# Patient Record
Sex: Male | Born: 1935 | Race: Black or African American | Hispanic: No | Smoking: Never smoker
Health system: Southern US, Community
[De-identification: ages and names within clinical notes are randomized; demographics above are authoritative.]

## PROBLEM LIST (undated history)

## (undated) DIAGNOSIS — N400 Enlarged prostate without lower urinary tract symptoms: Secondary | ICD-10-CM

## (undated) DIAGNOSIS — N189 Chronic kidney disease, unspecified: Secondary | ICD-10-CM

## (undated) DIAGNOSIS — K259 Gastric ulcer, unspecified as acute or chronic, without hemorrhage or perforation: Secondary | ICD-10-CM

## (undated) DIAGNOSIS — I10 Essential (primary) hypertension: Secondary | ICD-10-CM

## (undated) DIAGNOSIS — E785 Hyperlipidemia, unspecified: Secondary | ICD-10-CM

## (undated) HISTORY — DX: Benign prostatic hyperplasia without lower urinary tract symptoms: N40.0

## (undated) HISTORY — PX: COLONOSCOPY W/ POLYPECTOMY: SHX1380

## (undated) HISTORY — DX: Hyperlipidemia, unspecified: E78.5

## (undated) HISTORY — PX: HERNIA REPAIR: SHX51

## (undated) HISTORY — DX: Essential (primary) hypertension: I10

---

## 2007-01-22 ENCOUNTER — Ambulatory Visit: Payer: Self-pay | Admitting: Gastroenterology

## 2008-03-14 ENCOUNTER — Emergency Department: Payer: Self-pay | Admitting: Emergency Medicine

## 2010-03-11 ENCOUNTER — Emergency Department: Payer: Self-pay | Admitting: Emergency Medicine

## 2010-03-14 ENCOUNTER — Emergency Department: Payer: Self-pay | Admitting: Emergency Medicine

## 2010-07-01 ENCOUNTER — Emergency Department: Payer: Self-pay | Admitting: Unknown Physician Specialty

## 2010-09-19 ENCOUNTER — Ambulatory Visit: Payer: Self-pay | Admitting: Gastroenterology

## 2010-09-21 LAB — PATHOLOGY REPORT

## 2011-05-23 HISTORY — PX: COLONOSCOPY: SHX5424

## 2011-07-18 ENCOUNTER — Emergency Department: Payer: Self-pay | Admitting: Emergency Medicine

## 2012-05-14 IMAGING — US US PELVIS LIMITED
1 series · 17 of 25 positions shown · non-contrast
Comparison: none

REASON FOR EXAM: left tesrticular pain and swelling
COMMENTS:

[Series 1: us pelvis limited · 17 of 113 slices shown]
[im 1/113]
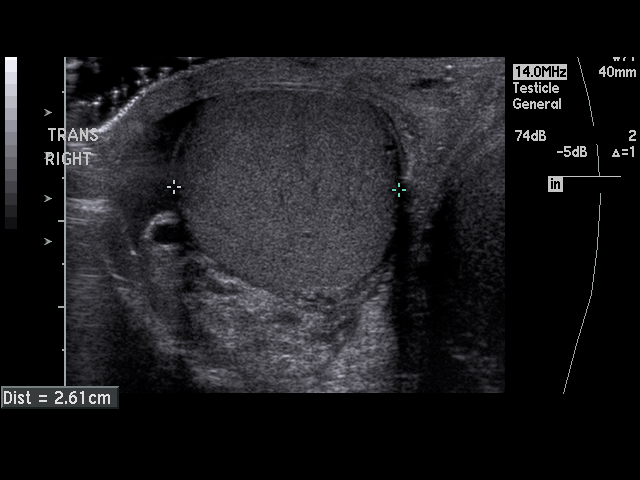
[im 10/113]
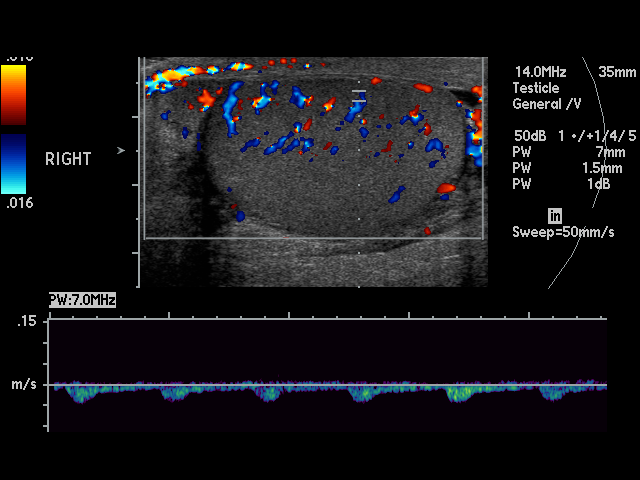
[im 15/113]
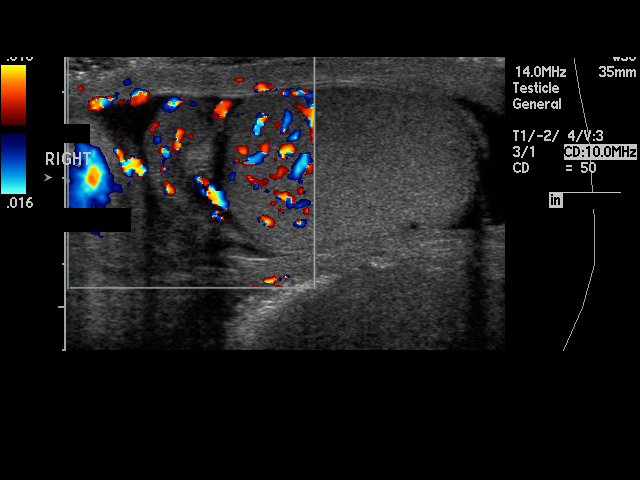
[im 24/113]
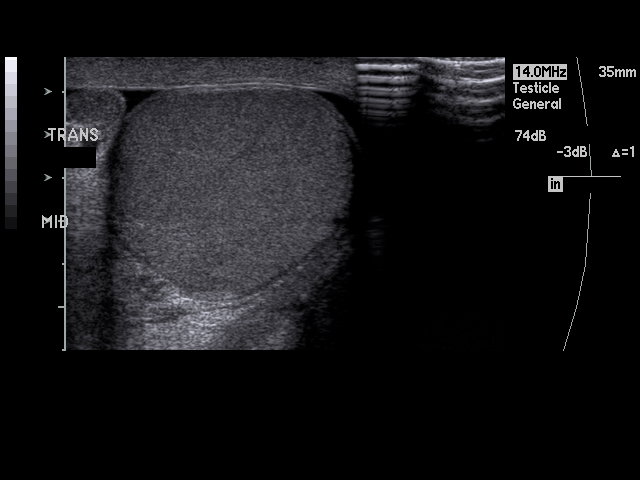
[im 29/113]
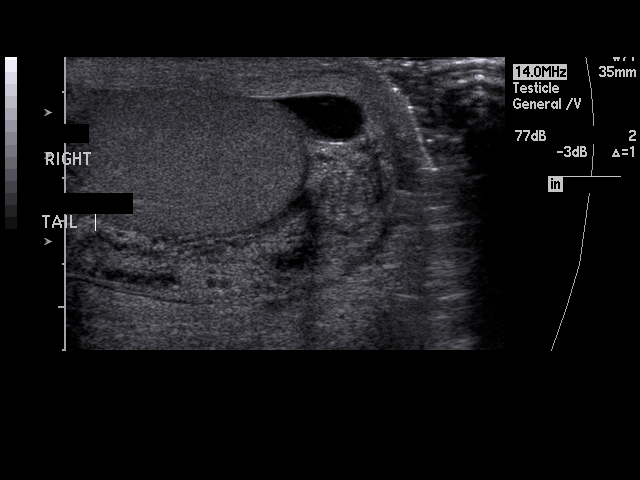
[im 38/113]
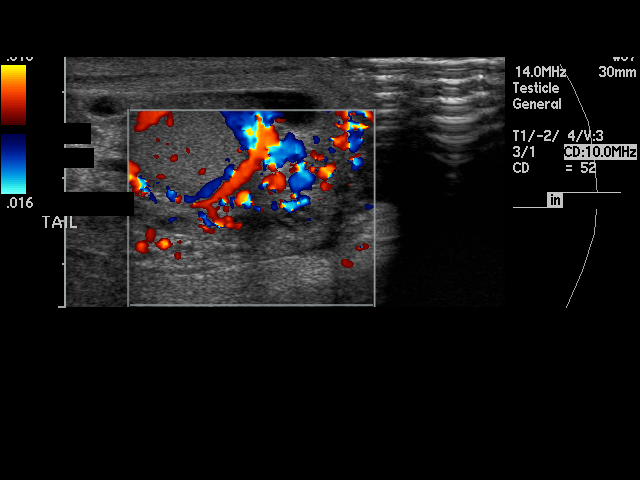
[im 43/113]
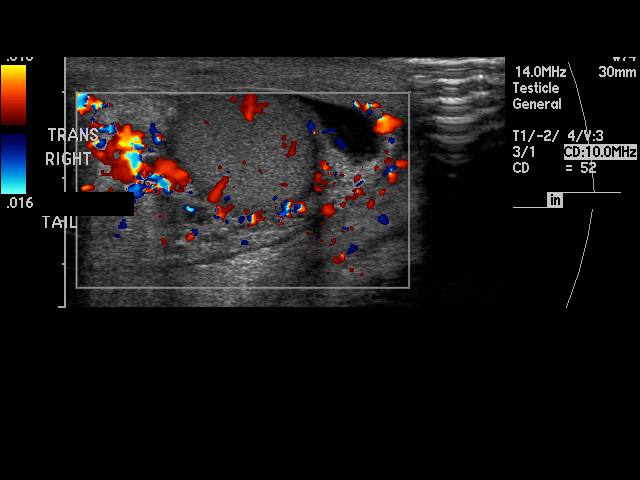
[im 52/113]
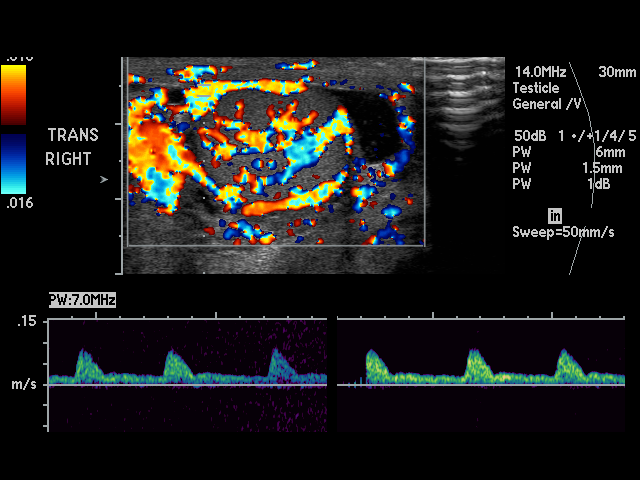
[im 57/113]
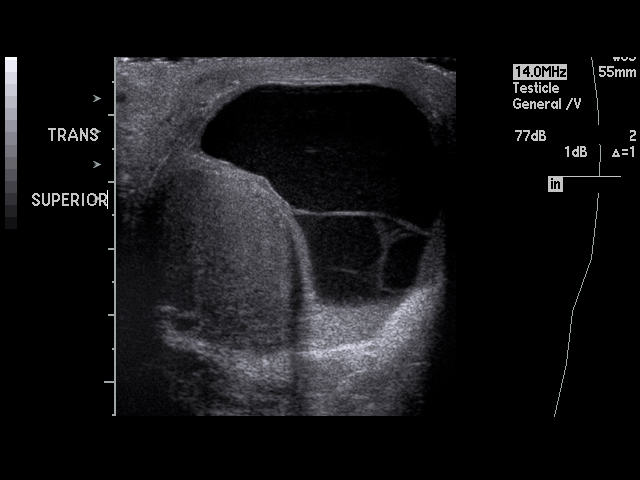
[im 61/113]
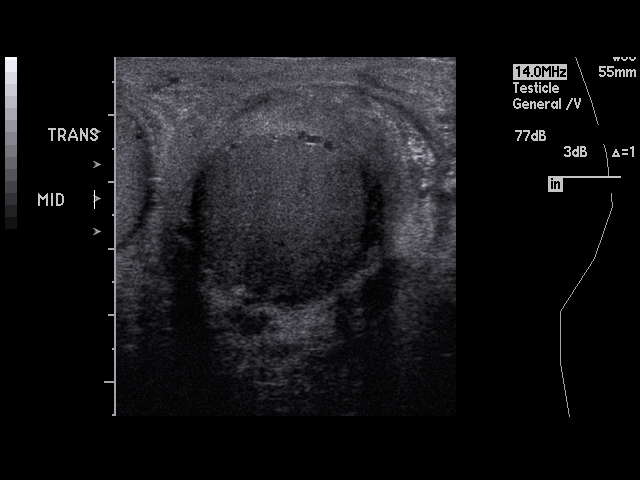
[im 71/113]
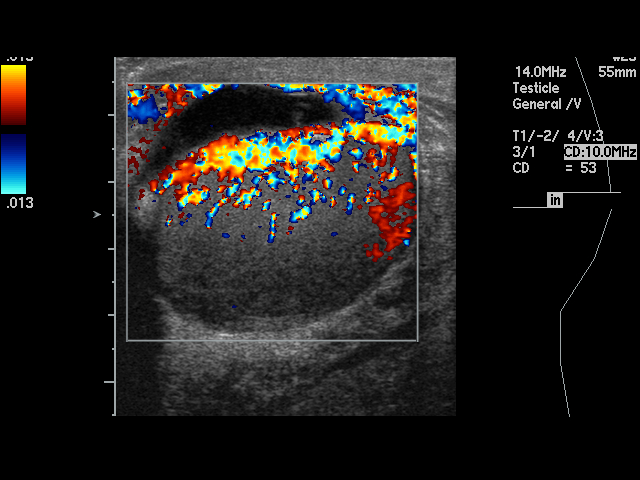
[im 75/113]
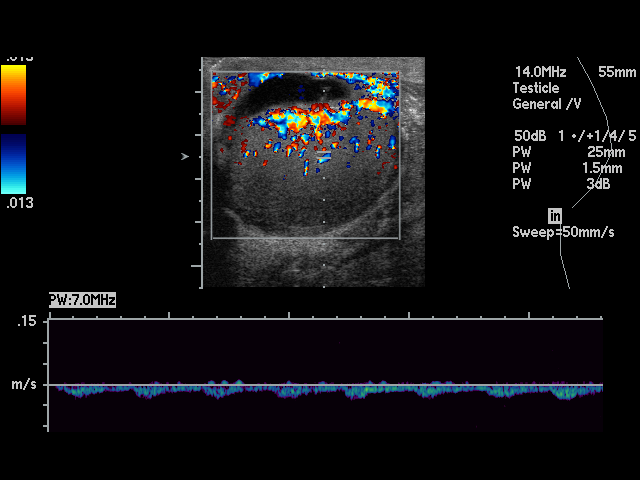
[im 85/113]
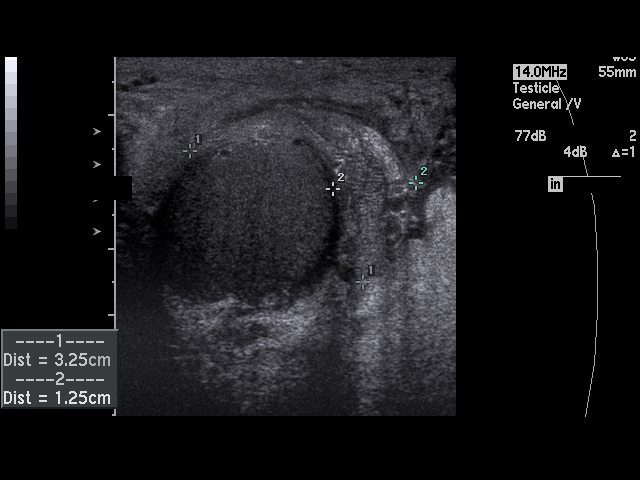
[im 89/113]
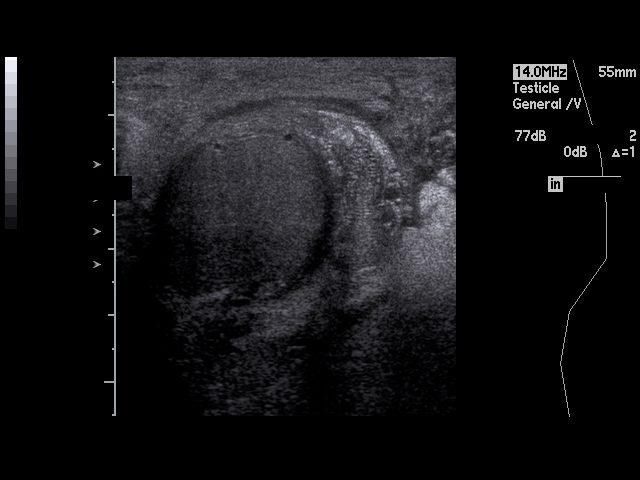
[im 99/113]
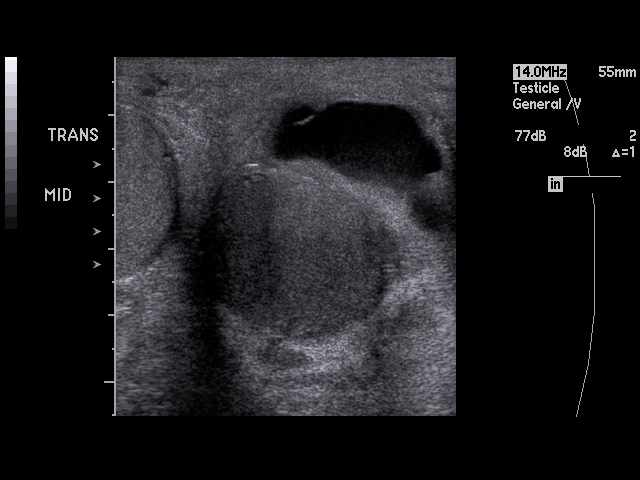
[im 103/113]
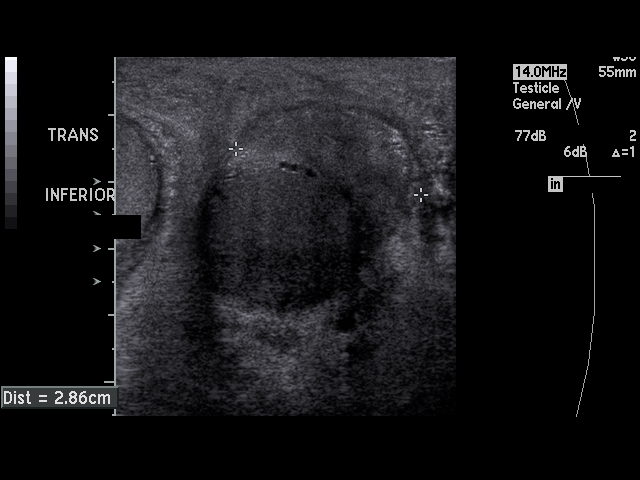
[im 113/113]
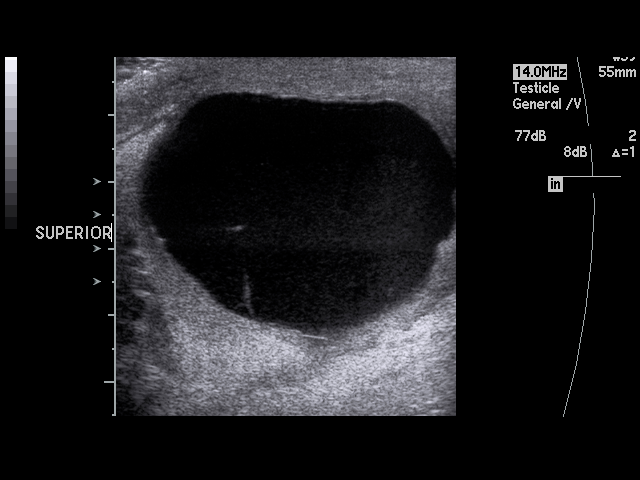

[17 of 25 positions shown; findings below may reference images not displayed]

PROCEDURE:     US  - US TESTICULAR  - July 19, 2011  [DATE]

RESULT:     The testes demonstrate normal echotexture and vascularity
bilaterally. The right testicle measures 3.9 x 2.4 x 2.6 cm. The left
testicle measures 4.3 x 2.6 x 2.6 cm.

There is a septated hypoechoic to anechoic structure with some layering
debris in the left aspect of the scrotum which may reflect a complicating
hydrocele or possibly a scrotal abscess. The epididymis on the left is
enlarged measuring 3.3 x 1.2 x 2.9 cm and exhibits increased vascularity.
The epididymal structures on the right are normal in appearance and
vascularity measuring 3 by 0.8 x 2.8 cm. Thickening of the scrotal wall is
seen diffusely.
IMPRESSION: 1. I see no intratesticular mass.
2. There is a complex appearing fluid collection on the left. This may
reflect a hydrocele into which hemorrhage has occurred or a possibly
infected hydrocele.
3. There is enlargement of hypervascularity of the epididymis suggesting
epididymitis. This overlying scrotal thickening. I do not see findings to
suggest air within the scrotal soft tissues.

A primary report was sent to the [HOSPITAL] the conclusion of
the study.

## 2012-06-03 ENCOUNTER — Ambulatory Visit: Payer: Self-pay | Admitting: Surgery

## 2012-06-03 ENCOUNTER — Ambulatory Visit: Payer: Self-pay | Admitting: Nephrology

## 2012-06-03 LAB — CBC WITH DIFFERENTIAL/PLATELET
Basophil #: 0 10*3/uL (ref 0.0–0.1)
Eosinophil #: 0 10*3/uL (ref 0.0–0.7)
HCT: 43.1 % (ref 40.0–52.0)
HGB: 15 g/dL (ref 13.0–18.0)
Lymphocyte #: 1.9 10*3/uL (ref 1.0–3.6)
Lymphocyte %: 37.7 %
MCHC: 34.7 g/dL (ref 32.0–36.0)
MCV: 97 fL (ref 80–100)
Monocyte #: 0.5 x10 3/mm (ref 0.2–1.0)
Monocyte %: 10.3 %
Neutrophil #: 2.6 10*3/uL (ref 1.4–6.5)
Neutrophil %: 50.6 %
RDW: 13 % (ref 11.5–14.5)
WBC: 5.1 10*3/uL (ref 3.8–10.6)

## 2012-06-03 LAB — BASIC METABOLIC PANEL
Chloride: 110 mmol/L — ABNORMAL HIGH (ref 98–107)
Co2: 25 mmol/L (ref 21–32)
Creatinine: 1.4 mg/dL — ABNORMAL HIGH (ref 0.60–1.30)
EGFR (Non-African Amer.): 49 — ABNORMAL LOW
Osmolality: 283 (ref 275–301)
Potassium: 4.3 mmol/L (ref 3.5–5.1)
Sodium: 141 mmol/L (ref 136–145)

## 2012-07-17 ENCOUNTER — Ambulatory Visit: Payer: Self-pay | Admitting: Surgery

## 2014-06-30 IMAGING — US US RENAL KIDNEY
1 series · 14 of 25 positions shown · non-contrast
Comparison: none

REASON FOR EXAM: chronic kidney disease
COMMENTS:

[Series 1: us renal kidney · 0.28mm/px · 14 of 32 slices shown]
[im 1/32]
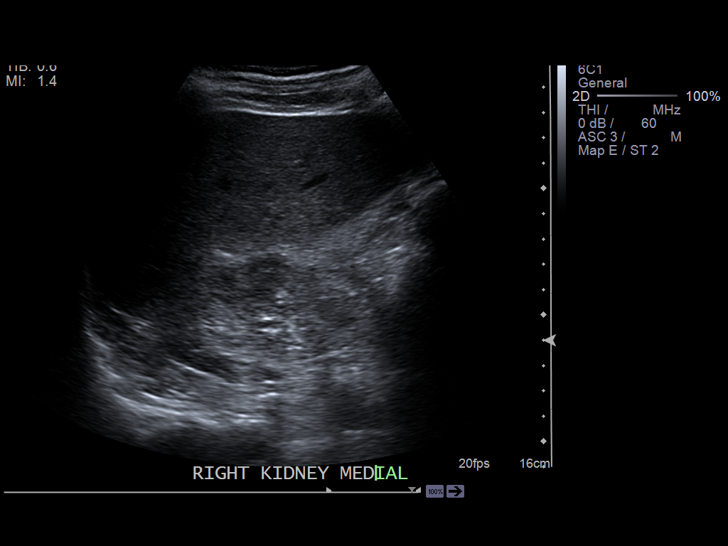
[im 3/32]
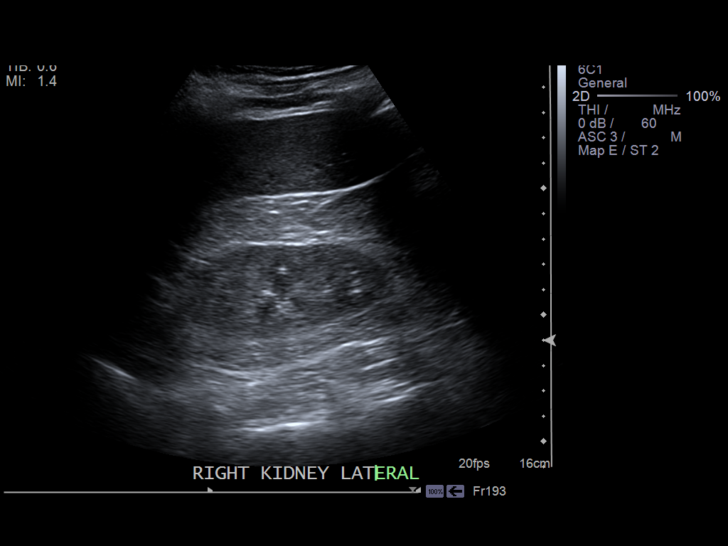
[im 6/32]
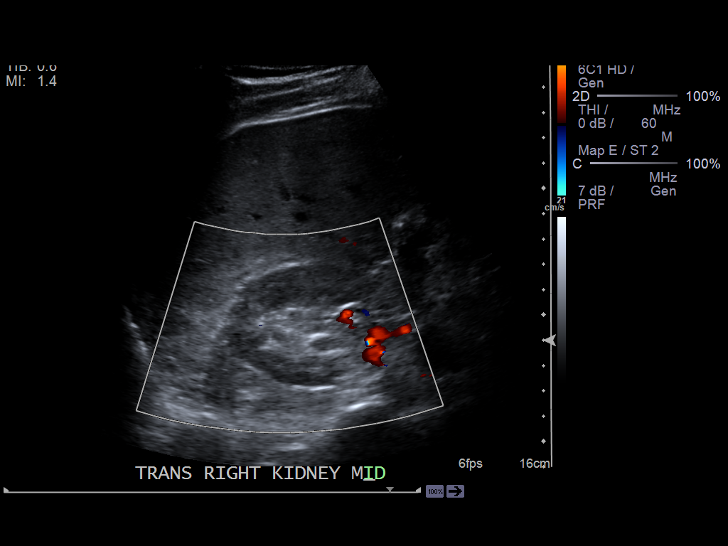
[im 8/32]
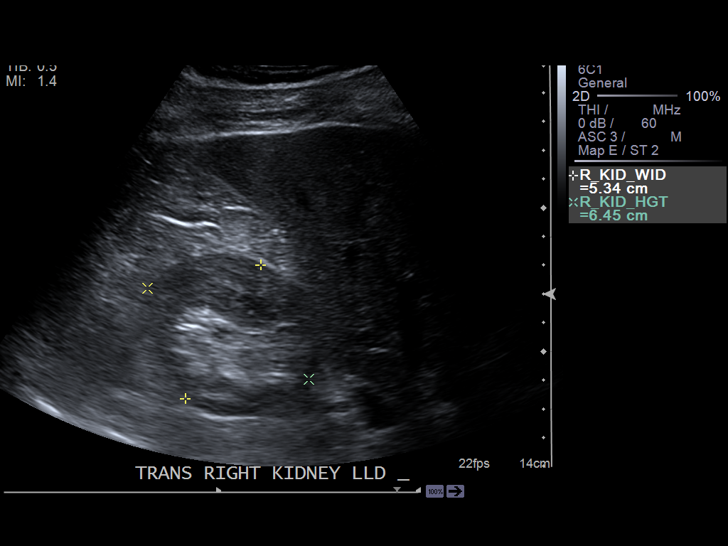
[im 11/32]
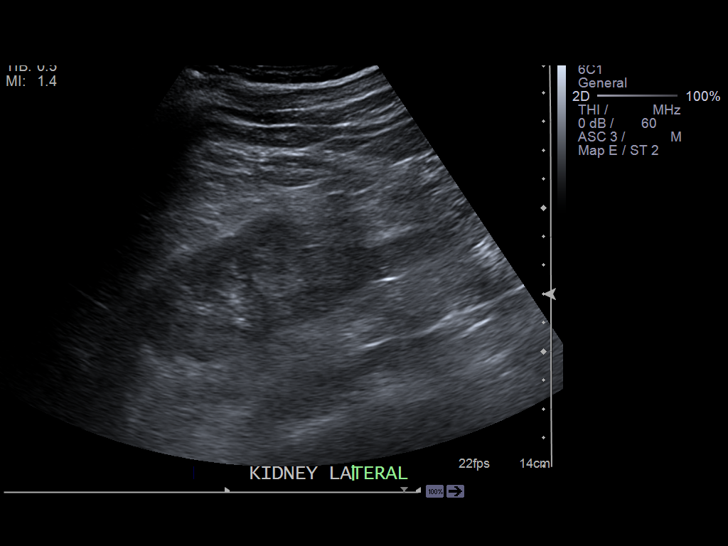
[im 12/32]
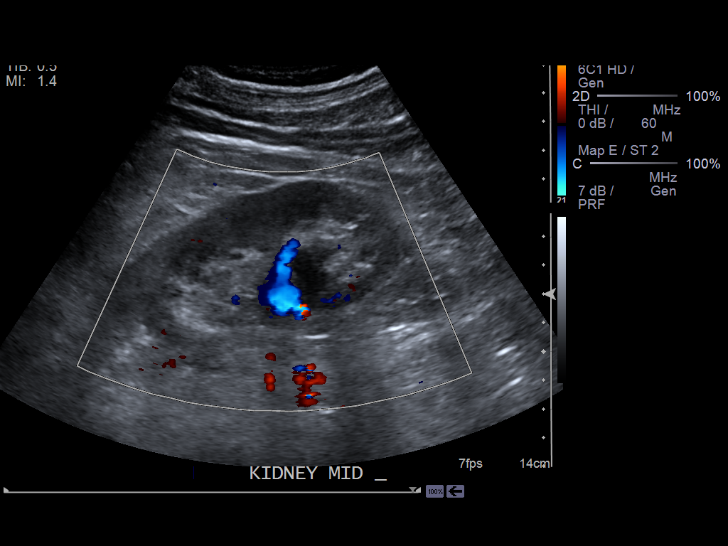
[im 15/32]
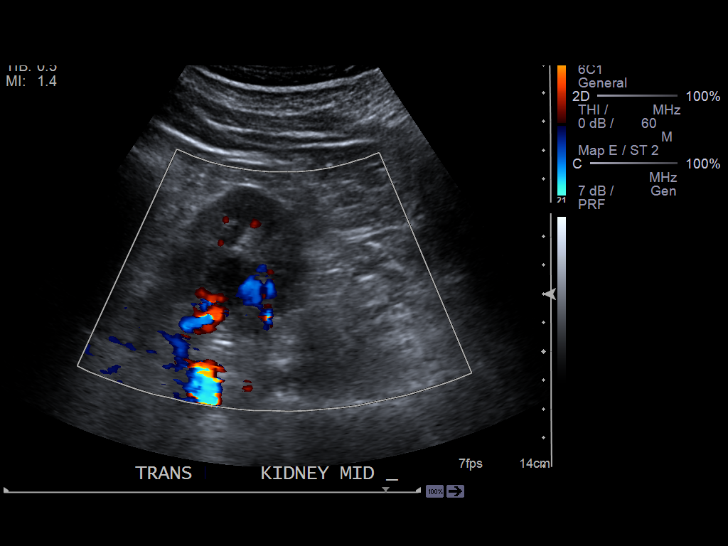
[im 17/32]
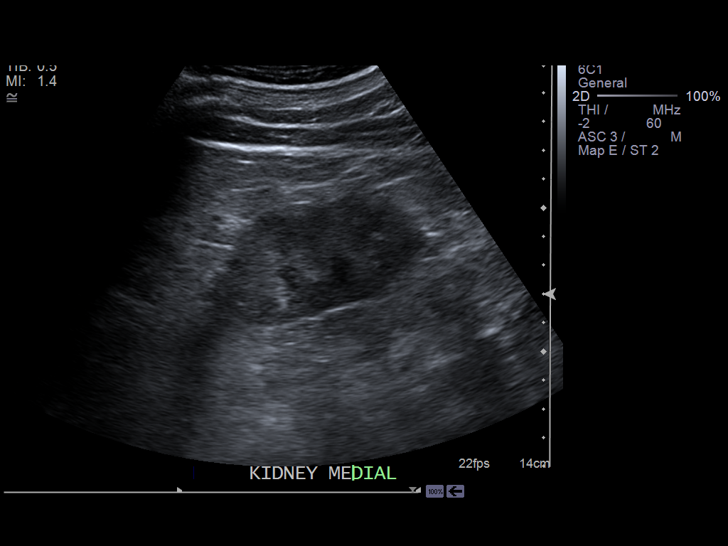
[im 20/32]
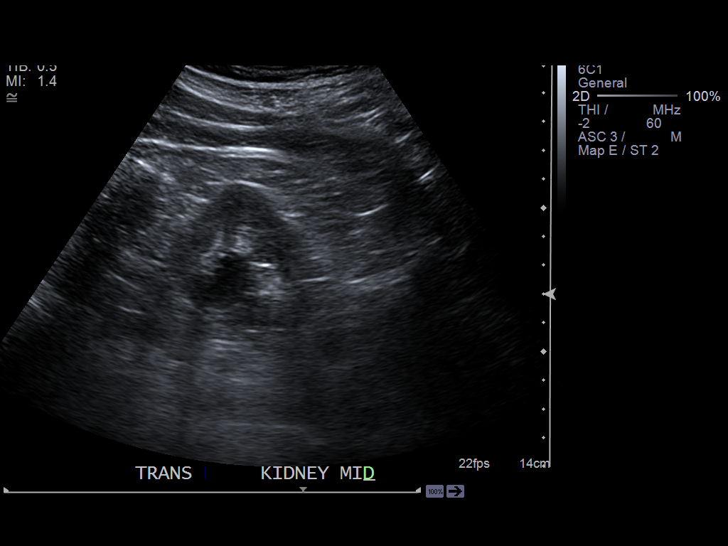
[im 21/32]
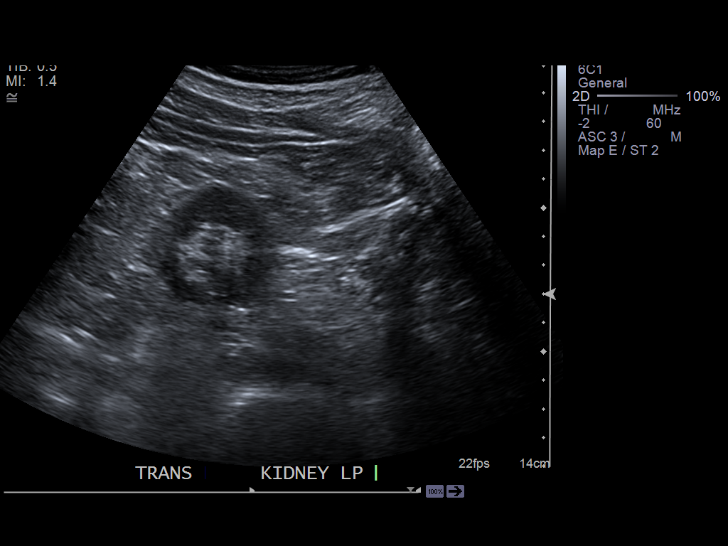
[im 24/32]
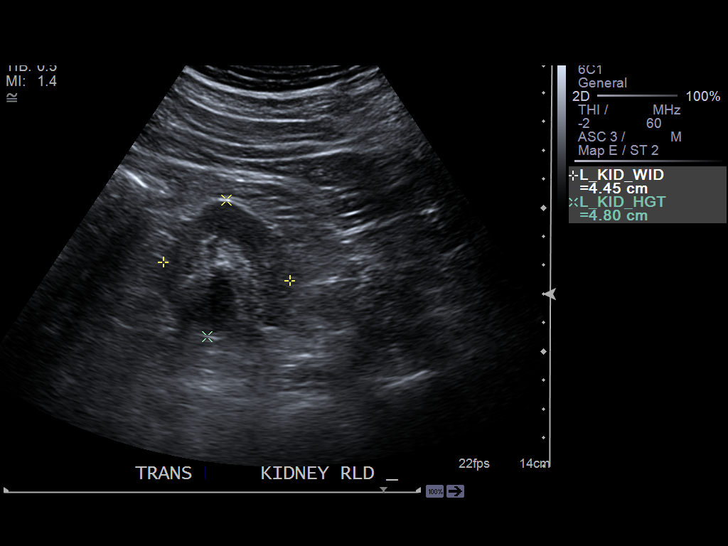
[im 26/32]
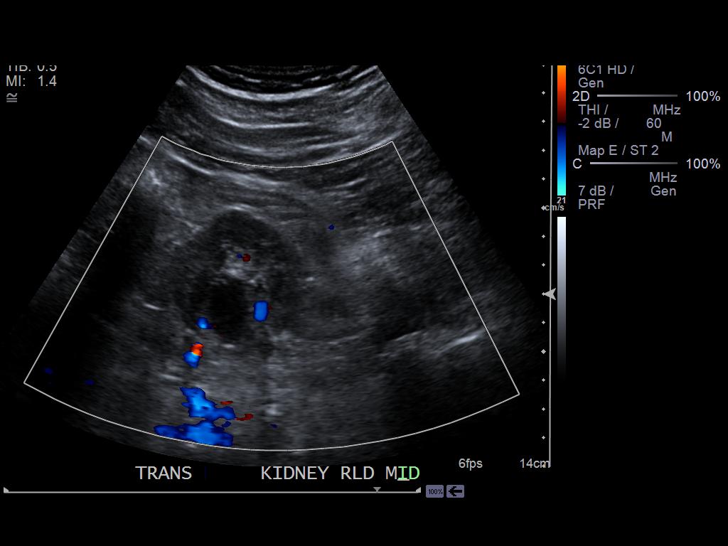
[im 29/32]
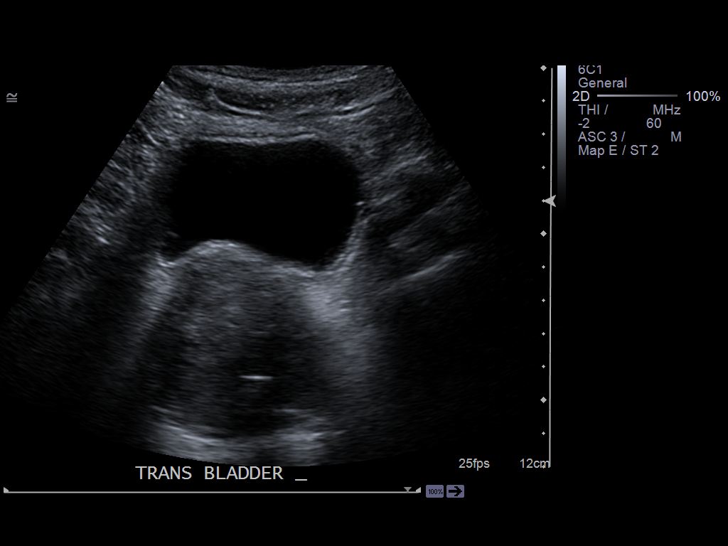
[im 32/32]
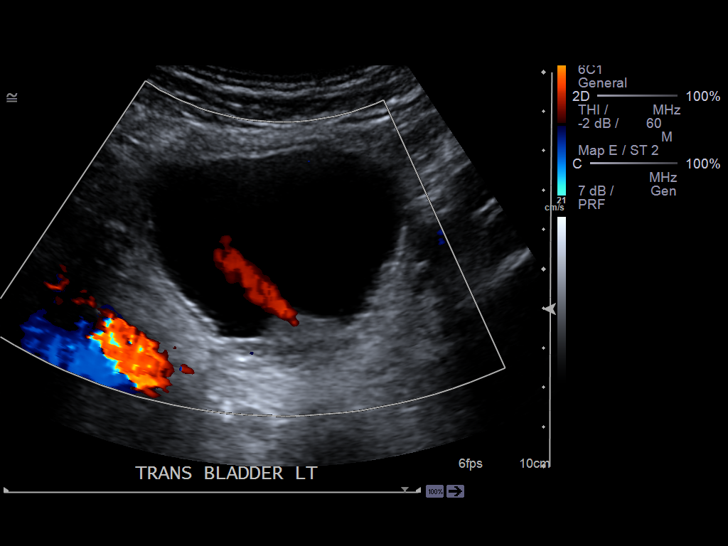

[14 of 25 positions shown; findings below may reference images not displayed]

PROCEDURE:     US  - US KIDNEY  - June 03, 2012  [DATE]

RESULT:     Comparison: None

Technique and findings: Multiple gray-scale and color doppler images of the
kidneys were obtained.

The right kidney measures 9.9 x 5.3 x 5.6 cm and the left kidney measures
11.4 x 4.9 x 4.5 cm. The kidneys are normal in echogenicity. There is no
hydronephrosis. There is mild left pelviectasis. There are no echogenic
foci.  There are no renal masses. There is no free fluid in the region of
the renal fossa.

There is prostatic enlargement. Bilaterally ureteral jets are noted. The
bladder is within normal limits.
IMPRESSION: Normal renal ultrasound.

[REDACTED]

## 2014-09-11 NOTE — Op Note (Signed)
PATIENT NAME:  Victor Walsh, Victor Walsh MR#:  [id number] DATE OF BIRTH:  [birth year]  DATE OF PROCEDURE:  07/17/2012  PREOPERATIVE DIAGNOSES: 1.  Bilateral inguinal hernias.  2.  Umbilical hernia.   POSTOPERATIVE DIAGNOSES:  1.  Bilateral inguinal hernias.  2.  Umbilical hernia.   PROCEDURES: 1.  Laparoscopic preperitoneal repair of bilateral inguinal hernias. 2.  Umbilical herniorrhaphy.  SURGEON:  Phoebe Perch, M.D.  ANESTHESIA:  General with endotracheal tube.   INDICATIONS: This is a patient with painful bulge requiring repair. Preoperatively, we discussed the rationale for surgery, the options of observation, the procedure itself in detail, and the risks of bleeding, infection, recurrence, ischemic orchitis, chronic pain syndrome, neuroma, and cosmetic deformity. This was all reviewed for him in the preop holding area. He understood and agreed to proceed.   FINDINGS:  Small umbilical hernia with incarcerated preperitoneal fat measuring less than 1 cm in size repaired primarily with Ethibond suture.   On the right inguinal hernia was a huge incarcerated direct space hernia and a large indirect space hernia. On the left was a large indirect space hernia with a weak floor.   DESCRIPTION OF PROCEDURE:  The patient was induced to general anesthesia, given IV antibiotics. A Foley catheter was placed. VTE prophylaxis was in place. He was prepped and draped in a sterile fashion. Marcaine was infiltrated in skin and subcutaneous tissues around the periumbilical area. Incision was made. Dissection down to the fascia was performed. The hernia sac was opened and reduced. Fascial edges were cleaned, and figure-of-eight 0 Ethibonds were placed to repair the umbilical hernia.   Additional Marcaine was placed and then attention was turned to the right rectus fascia, which was incised, the muscle retracted laterally. The [place] surgical dissecting balloon was placed,  followed by the [place] surgical structural balloon,  and the preperitoneal space was insufflated under direct vision. Ariahna Smiddy's ligament was delineated after placing 2 midline 5 mm ports. The lateral extent of dissection was determined bilaterally. The nerve was identified and kept in view at all times. The large incarcerated direct space hernia was reduced, as was the indirect hernia sac, which was dissected off of the cord structures on the right, and this was duplicated on the left as well, and then into the preperitoneal space was placed a large size laterally scissored Atrium mesh into each side, which was held in place with the [place] surgical tacker, avoiding the area of the nerve. The preperitoneal space was then desufflated under direct vision. All ports were removed. There was no sign of bowel exuding through peritoneal rents or coming into contact with mesh. Once the preperitoneal space was completely desufflated, all ports were removed. Fascial edges were approximated with figure-of-eight 0 Ethibonds, 3-0 Vicryl were utilized to tack the skin of the umbilicus back to the fascia, and deep sutures of 3-0 Vicryl were placed followed by 4-0 subcuticular Monocryl. Steri-Strips, Mastisol and sterile dressings were placed.   The patient tolerated the procedure well. There were no complications. He was taken to the recovery room in stable condition to be discharged in the care of his family. He will follow up in 10 days.     ____________________________ Jerrol Banana Burt Knack, MD rec:dm D: 07/17/2012 08:44:11 ET T: 07/17/2012 09:52:04 ET JOB#: [id number]  cc: Jerrol Banana. Burt Knack, MD, <Dictator> Florene Glen MD ELECTRONICALLY SIGNED 07/17/2012 16:14

## 2015-02-09 ENCOUNTER — Ambulatory Visit (INDEPENDENT_AMBULATORY_CARE_PROVIDER_SITE_OTHER): Payer: Medicare PPO | Admitting: Family Medicine

## 2015-02-09 ENCOUNTER — Encounter: Payer: Self-pay | Admitting: Family Medicine

## 2015-02-09 VITALS — BP 130/70 | HR 72 | Ht 69.0 in | Wt 195.0 lb

## 2015-02-09 DIAGNOSIS — I10 Essential (primary) hypertension: Secondary | ICD-10-CM | POA: Diagnosis not present

## 2015-02-09 DIAGNOSIS — Z23 Encounter for immunization: Secondary | ICD-10-CM

## 2015-02-09 DIAGNOSIS — Z1211 Encounter for screening for malignant neoplasm of colon: Secondary | ICD-10-CM | POA: Diagnosis not present

## 2015-02-09 DIAGNOSIS — E782 Mixed hyperlipidemia: Secondary | ICD-10-CM | POA: Diagnosis not present

## 2015-02-09 DIAGNOSIS — E559 Vitamin D deficiency, unspecified: Secondary | ICD-10-CM | POA: Diagnosis not present

## 2015-02-09 DIAGNOSIS — N400 Enlarged prostate without lower urinary tract symptoms: Secondary | ICD-10-CM

## 2015-02-09 LAB — POCT URINALYSIS DIPSTICK
Bilirubin, UA: NEGATIVE
Glucose, UA: NEGATIVE
KETONES UA: NEGATIVE
Leukocytes, UA: NEGATIVE
Nitrite, UA: NEGATIVE
PROTEIN UA: NEGATIVE
RBC UA: NEGATIVE
SPEC GRAV UA: 1.01
Urobilinogen, UA: 0.2
pH, UA: 6

## 2015-02-09 LAB — HEMOCCULT GUIAC POC 1CARD (OFFICE): Fecal Occult Blood, POC: NEGATIVE

## 2015-02-09 MED ORDER — DUTASTERIDE 0.5 MG PO CAPS: 0.5000 mg | ORAL_CAPSULE | Freq: Every day | ORAL | Status: DC

## 2015-02-09 MED ORDER — AMLODIPINE BESYLATE 5 MG PO TABS: 5.0000 mg | ORAL_TABLET | Freq: Every day | ORAL | Status: DC

## 2015-02-09 MED ORDER — CRESTOR 10 MG PO TABS: 10.0000 mg | ORAL_TABLET | Freq: Every day | ORAL | Status: DC

## 2015-02-09 NOTE — Progress Notes (Signed)
Name: Victor Walsh   MRN: [id number]    DOB: 11/30/1935   Date:02/09/2015       Progress Note  Subjective  Chief Complaint  Chief Complaint  Patient presents with  . Hypertension  . Hyperlipidemia  . Benign Prostatic Hypertrophy    Hypertension This is a chronic problem. The current episode started more than 1 year ago. The problem has been gradually improving since onset. Pertinent negatives include no anxiety, blurred vision, chest pain, headaches, malaise/fatigue, neck pain, orthopnea, palpitations, peripheral edema, PND or shortness of breath. Past treatments include calcium channel blockers. The current treatment provides mild improvement. There are no compliance problems.  There is no history of angina, kidney disease, CAD/MI, CVA, heart failure, left ventricular hypertrophy, PVD, renovascular disease or retinopathy. There is no history of chronic renal disease.  Hyperlipidemia This is a chronic problem. The problem is controlled. He has no history of chronic renal disease. There are no known factors aggravating his hyperlipidemia. Pertinent negatives include no chest pain, focal weakness, myalgias or shortness of breath. Current antihyperlipidemic treatment includes statins. The current treatment provides moderate improvement of lipids. There are no compliance problems.  Risk factors for coronary artery disease include hypertension, male sex and post-menopausal.  Benign Prostatic Hypertrophy This is a recurrent problem. The current episode started more than 1 year ago. The problem has been gradually improving since onset. Irritative symptoms do not include frequency, nocturia or urgency. "maybe one time". Obstructive symptoms include a weak stream. Obstructive symptoms do not include dribbling, incomplete emptying, an intermittent stream, a slower stream or straining. Pertinent negatives include no chills, dysuria, hematuria, hesitancy or nausea. Nothing aggravates the symptoms. Past  treatments include dutasteride.    No problem-specific assessment & plan notes found for this encounter.   Past Medical History  Diagnosis Date  . Hyperlipidemia   . Hypertension   . BPH (benign prostatic hyperplasia)     Past Surgical History  Procedure Laterality Date  . Hernia repair    . Colonoscopy  2013    cleared- Dr Gustavo Lah    Family History  Problem Relation Age of Onset  . Kidney disease Mother     Social History   Social History  . Marital Status: Single    Spouse Name: N/A  . Number of Children: N/A  . Years of Education: N/A   Occupational History  . Not on file.   Social History Main Topics  . Smoking status: Never Smoker   . Smokeless tobacco: Not on file  . Alcohol Use: No  . Drug Use: No  . Sexual Activity: No   Other Topics Concern  . Not on file   Social History Narrative  . No narrative on file    No Known Allergies   Review of Systems  Constitutional: Negative for fever, chills, weight loss and malaise/fatigue.  HENT: Negative for ear discharge, ear pain and sore throat.   Eyes: Negative for blurred vision.  Respiratory: Negative for cough, sputum production, shortness of breath and wheezing.   Cardiovascular: Negative for chest pain, palpitations, orthopnea, leg swelling and PND.  Gastrointestinal: Negative for heartburn, nausea, abdominal pain, diarrhea, constipation, blood in stool and melena.  Genitourinary: Negative for dysuria, hesitancy, urgency, frequency, hematuria, incomplete emptying and nocturia.  Musculoskeletal: Negative for myalgias, back pain, joint pain and neck pain.  Skin: Negative for rash.  Neurological: Negative for dizziness, tingling, sensory change, focal weakness and headaches.  Endo/Heme/Allergies: Negative for environmental allergies and polydipsia. Does not bruise/bleed  easily.  Psychiatric/Behavioral: Negative for depression and suicidal ideas. The patient is not nervous/anxious and does not have  insomnia.      Objective  Filed Vitals:   02/09/15 1503  BP: 130/70  Pulse: 72  Height: 5\' 9"  (1.753 m)  Weight: 195 lb (88.451 kg)    Physical Exam  Constitutional: He is oriented to person, place, and time and well-developed, well-nourished, and in no distress.  HENT:  Head: Normocephalic.  Right Ear: External ear normal.  Left Ear: External ear normal.  Nose: Nose normal.  Mouth/Throat: Oropharynx is clear and moist.  Eyes: Conjunctivae and EOM are normal. Pupils are equal, round, and reactive to light. Right eye exhibits no discharge. Left eye exhibits no discharge. No scleral icterus.  Neck: Normal range of motion. Neck supple. No JVD present. No tracheal deviation present. No thyromegaly present.  Cardiovascular: Normal rate, regular rhythm, normal heart sounds and intact distal pulses.  Exam reveals no gallop and no friction rub.   No murmur heard. Pulmonary/Chest: Breath sounds normal. No respiratory distress. He has no wheezes. He has no rales.  Abdominal: Soft. Bowel sounds are normal. He exhibits no mass. There is no hepatosplenomegaly. There is no tenderness. There is no rebound, no guarding and no CVA tenderness.  Genitourinary: Rectum normal and prostate normal. Guaiac negative stool.  Musculoskeletal: Normal range of motion. He exhibits no edema or tenderness.  Lymphadenopathy:    He has no cervical adenopathy.  Neurological: He is alert and oriented to person, place, and time. He has normal sensation, normal strength, normal reflexes and intact cranial nerves. No cranial nerve deficit.  Skin: Skin is warm. No rash noted.  Psychiatric: Mood and affect normal.      Assessment & Plan  Problem List Items Addressed This Visit      Cardiovascular and Mediastinum   Essential hypertension - Primary   Relevant Medications   amLODipine (NORVASC) 5 MG tablet   CRESTOR 10 MG tablet   Other Relevant Orders   POCT Urinalysis Dipstick (Completed)   Renal Function  Panel     Musculoskeletal and Integument   Hyperlipidemia type III   Relevant Medications   CRESTOR 10 MG tablet   Other Relevant Orders   Lipid Profile     Genitourinary   Benign prostatic hypertrophy   Relevant Medications   dutasteride (AVODART) 0.5 MG capsule   Other Relevant Orders   PSA     Other   Vitamin D deficiency    Other Visit Diagnoses    Colon cancer screening        Relevant Orders    POCT Occult Blood Stool (Completed)    Need for pneumococcal vaccination        Relevant Orders    Pneumococcal conjugate vaccine 13-valent (Completed)    Need for influenza vaccination        Relevant Orders    Flu Vaccine QUAD 36+ mos PF IM (Fluarix & Fluzone Quad PF) (Completed)         Dr. Deanna Jones [facility]  02/09/2015

## 2015-02-10 LAB — RENAL FUNCTION PANEL
ALBUMIN: 4.3 g/dL (ref 3.5–4.8)
BUN/Creatinine Ratio: 17 (ref 10–22)
BUN: 24 mg/dL (ref 8–27)
CALCIUM: 9.3 mg/dL (ref 8.6–10.2)
CO2: 25 mmol/L (ref 18–29)
CREATININE: 1.44 mg/dL — AB (ref 0.76–1.27)
Chloride: 101 mmol/L (ref 97–108)
GFR calc Af Amer: 54 mL/min/{1.73_m2} — ABNORMAL LOW (ref >59)
GFR calc non Af Amer: 47 mL/min/{1.73_m2} — ABNORMAL LOW (ref >59)
Glucose: 102 mg/dL — ABNORMAL HIGH (ref 65–99)
PHOSPHORUS: 3.6 mg/dL (ref 2.5–4.5)
POTASSIUM: 4.6 mmol/L (ref 3.5–5.2)
SODIUM: 141 mmol/L (ref 134–144)

## 2015-02-10 LAB — LIPID PANEL
CHOLESTEROL TOTAL: 235 mg/dL — AB (ref 100–199)
Chol/HDL Ratio: 4.1 ratio units (ref 0.0–5.0)
HDL: 57 mg/dL (ref >39)
LDL CALC: 151 mg/dL — AB (ref 0–99)
TRIGLYCERIDES: 137 mg/dL (ref 0–149)
VLDL Cholesterol Cal: 27 mg/dL (ref 5–40)

## 2015-02-10 LAB — PSA: Prostate Specific Ag, Serum: 2.8 ng/mL (ref 0.0–4.0)

## 2015-03-05 ENCOUNTER — Other Ambulatory Visit: Payer: Self-pay

## 2015-03-09 ENCOUNTER — Other Ambulatory Visit: Payer: Self-pay

## 2015-09-14 ENCOUNTER — Ambulatory Visit (INDEPENDENT_AMBULATORY_CARE_PROVIDER_SITE_OTHER): Payer: Medicare Other | Admitting: Family Medicine

## 2015-09-14 ENCOUNTER — Encounter: Payer: Self-pay | Admitting: Family Medicine

## 2015-09-14 VITALS — BP 130/88 | HR 70 | Ht 69.0 in | Wt 198.0 lb

## 2015-09-14 DIAGNOSIS — E559 Vitamin D deficiency, unspecified: Secondary | ICD-10-CM | POA: Diagnosis not present

## 2015-09-14 DIAGNOSIS — E782 Mixed hyperlipidemia: Secondary | ICD-10-CM | POA: Diagnosis not present

## 2015-09-14 DIAGNOSIS — K921 Melena: Secondary | ICD-10-CM

## 2015-09-14 DIAGNOSIS — Z0000 Encounter for general adult medical examination without abnormal findings: Secondary | ICD-10-CM

## 2015-09-14 DIAGNOSIS — I10 Essential (primary) hypertension: Secondary | ICD-10-CM | POA: Diagnosis not present

## 2015-09-14 DIAGNOSIS — N400 Enlarged prostate without lower urinary tract symptoms: Secondary | ICD-10-CM

## 2015-09-14 DIAGNOSIS — Z23 Encounter for immunization: Secondary | ICD-10-CM

## 2015-09-14 LAB — POCT URINALYSIS DIPSTICK
Bilirubin, UA: NEGATIVE
GLUCOSE UA: NEGATIVE
KETONES UA: NEGATIVE
Leukocytes, UA: NEGATIVE
Nitrite, UA: NEGATIVE
PROTEIN UA: NEGATIVE
RBC UA: NEGATIVE
SPEC GRAV UA: 1.01
UROBILINOGEN UA: 0.2
pH, UA: 5

## 2015-09-14 LAB — HEMOCCULT GUIAC POC 1CARD (OFFICE): FECAL OCCULT BLD: POSITIVE — AB

## 2015-09-14 MED ORDER — DUTASTERIDE 0.5 MG PO CAPS: 0.5000 mg | ORAL_CAPSULE | Freq: Every day | ORAL | Status: DC

## 2015-09-14 MED ORDER — CRESTOR 10 MG PO TABS: 10.0000 mg | ORAL_TABLET | Freq: Every day | ORAL | Status: DC

## 2015-09-14 MED ORDER — VITAMIN D (ERGOCALCIFEROL) 1.25 MG (50000 UNIT) PO CAPS: 50000.0000 [IU] | ORAL_CAPSULE | ORAL | Status: DC

## 2015-09-14 MED ORDER — AMLODIPINE BESYLATE 5 MG PO TABS: 5.0000 mg | ORAL_TABLET | Freq: Every day | ORAL | Status: DC

## 2015-09-14 NOTE — Progress Notes (Signed)
Patient: Victor Walsh, Male    DOB: 11-Oct-1935, 80 y.o.   MRN: [id number] Visit Date: 09/14/2015  Today's Provider: Otilio Miu, MD   Chief Complaint  Patient presents with  . Annual Exam  . Hypertension  . Hyperlipidemia  . Benign Prostatic Hypertrophy  . Immunizations    needs TDAP and written RX for Zostavax   Subjective:   Initial preventative physical exam Victor Walsh is a 80 y.o. male who presents today for his Initial Preventative Physical Exam. He feels well. He reports exercising intermitently. He reports he is sleeping well.  Hypertension This is a chronic problem. The current episode started more than 1 year ago. The problem has been gradually improving since onset. The problem is controlled. Pertinent negatives include no anxiety, blurred vision, chest pain, headaches, malaise/fatigue, neck pain, orthopnea, palpitations, peripheral edema, PND, shortness of breath or sweats. There are no associated agents to hypertension. Risk factors for coronary artery disease include dyslipidemia, post-menopausal state and obesity. Past treatments include calcium channel blockers. The current treatment provides no improvement. There are no compliance problems.  There is no history of angina, kidney disease, CAD/MI, CVA, heart failure, left ventricular hypertrophy, PVD, renovascular disease or retinopathy. There is no history of chronic renal disease or a hypertension causing med.  Hyperlipidemia This is a chronic problem. The current episode started more than 1 year ago. The problem is controlled. Recent lipid tests were reviewed and are normal. He has no history of chronic renal disease, diabetes, hypothyroidism, liver disease, obesity or nephrotic syndrome. There are no known factors aggravating his hyperlipidemia. Pertinent negatives include no chest pain, focal sensory loss, focal weakness, leg pain, myalgias or shortness of breath. He is currently on no antihyperlipidemic treatment. The current  treatment provides no improvement of lipids. There are no compliance problems.   Benign Prostatic Hypertrophy This is a chronic problem. The current episode started more than 1 year ago. The problem has been waxing and waning since onset. Irritative symptoms do not include frequency, nocturia or urgency. Obstructive symptoms do not include dribbling, incomplete emptying, an intermittent stream, a slower stream, straining or a weak stream. Pertinent negatives include no chills, dysuria, hematuria, hesitancy, nausea or vomiting. Nothing aggravates the symptoms. Past treatments include dutasteride. The treatment provided moderate relief.    Review of Systems  Constitutional: Negative for fever, chills, malaise/fatigue, appetite change, fatigue and unexpected weight change.  HENT: Negative for ear pain, facial swelling, hearing loss, nosebleeds, sneezing, sore throat and trouble swallowing.   Eyes: Negative for blurred vision, photophobia, pain, discharge, redness, itching and visual disturbance.  Respiratory: Negative for cough, choking, chest tightness, shortness of breath and wheezing.   Cardiovascular: Negative for chest pain, palpitations, orthopnea, leg swelling and PND.  Gastrointestinal: Negative for nausea, vomiting, abdominal pain, diarrhea, constipation, blood in stool and rectal pain.  Endocrine: Negative for cold intolerance, heat intolerance, polydipsia, polyphagia and polyuria.  Genitourinary: Negative for dysuria, hesitancy, urgency, frequency, hematuria, flank pain, decreased urine volume, discharge, penile swelling, scrotal swelling, difficulty urinating, penile pain, testicular pain, incomplete emptying and nocturia.  Musculoskeletal: Negative for myalgias, back pain, joint swelling, neck pain and neck stiffness.  Skin: Negative for color change and rash.  Allergic/Immunologic: Negative for immunocompromised state.  Neurological: Negative for dizziness, tremors, focal weakness,  seizures, syncope, speech difficulty, weakness, light-headedness, numbness and headaches.  Hematological: Does not bruise/bleed easily.  Psychiatric/Behavioral: Negative for suicidal ideas, hallucinations, behavioral problems, confusion, self-injury, dysphoric mood and agitation. The patient is not nervous/anxious.  Social History   Social History  . Marital Status: Single    Spouse Name: N/A  . Number of Children: N/A  . Years of Education: N/A   Occupational History  . Not on file.   Social History Main Topics  . Smoking status: Never Smoker   . Smokeless tobacco: Not on file  . Alcohol Use: No  . Drug Use: No  . Sexual Activity: No   Other Topics Concern  . Not on file   Social History Narrative    Patient Active Problem List   Diagnosis Date Noted  . Essential hypertension 02/09/2015  . Hyperlipidemia type III 02/09/2015  . Benign prostatic hypertrophy 02/09/2015  . Vitamin D deficiency 02/09/2015    Past Surgical History  Procedure Laterality Date  . Hernia repair    . Colonoscopy  2013    cleared- Dr Gustavo Lah    His family history includes Kidney disease in his mother.    Previous Medications   No medications on file    Patient Care Team: Juline Patch, MD as PCP - General (Family Medicine)     Objective:   Vitals: BP 130/88 mmHg  Pulse 70  Ht 5\' 9"  (1.753 m)  Wt 198 lb (89.812 kg)  BMI 29.23 kg/m2  Physical Exam  Constitutional: He is oriented to person, place, and time. He appears well-developed and well-nourished.  HENT:  Head: Normocephalic.  Right Ear: Tympanic membrane, external ear and ear canal normal.  Left Ear: Tympanic membrane, external ear and ear canal normal.  Nose: Nose normal.  Mouth/Throat: Oropharynx is clear and moist.  Eyes: Conjunctivae and EOM are normal. Pupils are equal, round, and reactive to light.  Neck: Normal range of motion. Neck supple.  Cardiovascular: Normal rate, regular rhythm, normal heart sounds  and intact distal pulses.   Pulmonary/Chest: Effort normal and breath sounds normal.  Abdominal: Soft. Bowel sounds are normal.  Genitourinary: Rectum normal, prostate normal and penis normal.  Musculoskeletal: Normal range of motion.  Neurological: He is alert and oriented to person, place, and time. He has normal reflexes.  Skin: Skin is warm and dry.  Psychiatric: He has a normal mood and affect. His behavior is normal. Judgment and thought content normal.  Nursing note and vitals reviewed.    No exam data present  Activities of Daily Living In your present state of health, do you have any difficulty performing the following activities: 09/14/2015  Hearing? N  Vision? N  Difficulty concentrating or making decisions? N  Walking or climbing stairs? N  Dressing or bathing? N  Doing errands, shopping? N    Fall Risk Assessment Fall Risk  09/14/2015  Falls in the past year? No     Patient reports there are safety devices in place in shower at home.   Depression Screen PHQ 2/9 Scores 09/14/2015  PHQ - 2 Score 0    Cognitive Testing - 6-CIT   Correct? Score   What year is it? yes 0 Yes = 0    No = 4  What month is it? yes 0 Yes = 0    No = 3  Remember:     [address], [place], [place]     What time is it? yes 0 Yes = 0    No = 3  Count backwards from 20 to 1 yes 0 Correct = 0    1 error = 2   More than 1 error = 4  Say the  months of the year in reverse. yes 0 Correct = 0    1 error = 2   More than 1 error = 4  What address did I ask you to remember? yes 0 Correct = 0  1 error = 2    2 error = 4    3 error = 6    4 error = 8    All wrong = 10       TOTAL SCORE  0/28   Interpretation:  Normal  Normal (0-7) Abnormal (8-28)     Assessment & Plan:     Initial Preventative Physical Exam  Reviewed patient's Family Medical History Reviewed and updated list of patient's medical providers Assessment of cognitive impairment was done Assessed patient's functional  ability Established a written schedule for health screening [place] Completed and Reviewed  Exercise Activities and Dietary recommendations Goals    None      Immunization History  Administered Date(s) Administered  . Influenza Split 02/09/2015  . Influenza,inj,Quad PF,36+ Mos 02/09/2015  . Pneumococcal Conjugate-13 02/09/2015  . Pneumococcal Polysaccharide-23 05/22/2006  . Tdap 09/14/2015    Health Maintenance  Topic Date Due  . TETANUS/TDAP  03/18/1957  . ZOSTAVAX  03/18/1998  . INFLUENZA VACCINE  12/21/2015  . PNA vac Low Risk Adult  Completed      Discussed health benefits of physical activity, and encouraged him to engage in regular exercise appropriate for his age and condition.    ------------------------------------------------------------------------------------------------------------   Problem List Items Addressed This Visit      Cardiovascular and Mediastinum   Essential hypertension   Relevant Medications   amLODipine (NORVASC) 5 MG tablet   CRESTOR 10 MG tablet   Other Relevant Orders   Renal Function Panel   POCT urinalysis dipstick (Completed)     Musculoskeletal and Integument   Hyperlipidemia type III   Relevant Medications   CRESTOR 10 MG tablet   Other Relevant Orders   Lipid Profile     Genitourinary   Benign prostatic hypertrophy   Relevant Medications   dutasteride (AVODART) 0.5 MG capsule   Other Relevant Orders   PSA     Other   Vitamin D deficiency    Other Visit Diagnoses    Medicare annual wellness visit, subsequent    -  Primary    Hematochezia        Relevant Orders    CBC w/Diff/Platelet    POCT Occult Blood Stool (Completed)    Need for Tdap vaccination        Relevant Orders    Tdap vaccine greater than or equal to 7yo IM (Completed)        Otilio Miu, MD [facility]  09/14/2015

## 2015-09-15 LAB — CBC WITH DIFFERENTIAL/PLATELET
BASOS: 1 %
Basophils Absolute: 0 10*3/uL (ref 0.0–0.2)
EOS (ABSOLUTE): 0 10*3/uL (ref 0.0–0.4)
Eos: 1 %
Hematocrit: 45.9 % (ref 37.5–51.0)
Hemoglobin: 15.5 g/dL (ref 12.6–17.7)
IMMATURE GRANULOCYTES: 0 %
Immature Grans (Abs): 0 10*3/uL (ref 0.0–0.1)
Lymphocytes Absolute: 1.6 10*3/uL (ref 0.7–3.1)
Lymphs: 45 %
MCH: 32.6 pg (ref 26.6–33.0)
MCHC: 33.8 g/dL (ref 31.5–35.7)
MCV: 97 fL (ref 79–97)
MONOS ABS: 0.4 10*3/uL (ref 0.1–0.9)
Monocytes: 11 %
NEUTROS PCT: 42 %
Neutrophils Absolute: 1.4 10*3/uL (ref 1.4–7.0)
PLATELETS: 166 10*3/uL (ref 150–379)
RBC: 4.75 x10E6/uL (ref 4.14–5.80)
RDW: 13.5 % (ref 12.3–15.4)
WBC: 3.4 10*3/uL (ref 3.4–10.8)

## 2015-09-15 LAB — RENAL FUNCTION PANEL
ALBUMIN: 4.2 g/dL (ref 3.5–4.8)
BUN/Creatinine Ratio: 12 (ref 10–24)
BUN: 17 mg/dL (ref 8–27)
CALCIUM: 9.3 mg/dL (ref 8.6–10.2)
CO2: 23 mmol/L (ref 18–29)
Chloride: 102 mmol/L (ref 96–106)
Creatinine, Ser: 1.37 mg/dL — ABNORMAL HIGH (ref 0.76–1.27)
GFR calc Af Amer: 57 mL/min/{1.73_m2} — ABNORMAL LOW (ref >59)
GFR, EST NON AFRICAN AMERICAN: 49 mL/min/{1.73_m2} — AB (ref >59)
GLUCOSE: 106 mg/dL — AB (ref 65–99)
PHOSPHORUS: 2.8 mg/dL (ref 2.5–4.5)
POTASSIUM: 4.6 mmol/L (ref 3.5–5.2)
SODIUM: 144 mmol/L (ref 134–144)

## 2015-09-15 LAB — LIPID PANEL
CHOL/HDL RATIO: 3.3 ratio (ref 0.0–5.0)
Cholesterol, Total: 206 mg/dL — ABNORMAL HIGH (ref 100–199)
HDL: 62 mg/dL (ref >39)
LDL CALC: 128 mg/dL — AB (ref 0–99)
TRIGLYCERIDES: 80 mg/dL (ref 0–149)
VLDL Cholesterol Cal: 16 mg/dL (ref 5–40)

## 2015-09-15 LAB — PSA: PROSTATE SPECIFIC AG, SERUM: 2.3 ng/mL (ref 0.0–4.0)

## 2015-10-01 ENCOUNTER — Other Ambulatory Visit: Payer: Self-pay

## 2016-08-21 ENCOUNTER — Encounter: Payer: Self-pay | Admitting: *Deleted

## 2016-08-22 ENCOUNTER — Encounter: Payer: Self-pay | Admitting: Anesthesiology

## 2016-08-22 ENCOUNTER — Ambulatory Visit: Payer: Medicare Other | Admitting: Anesthesiology

## 2016-08-22 ENCOUNTER — Ambulatory Visit
Admission: RE | Admit: 2016-08-22 | Discharge: 2016-08-22 | Disposition: A | Discharge status: Home / Self Care | Payer: Medicare Other | Source: Ambulatory Visit | Attending: Gastroenterology | Admitting: Gastroenterology

## 2016-08-22 ENCOUNTER — Encounter
Admission: RE | Disposition: A | Discharge status: Home / Self Care | Payer: Self-pay | Source: Ambulatory Visit | Attending: Gastroenterology

## 2016-08-22 DIAGNOSIS — K5730 Diverticulosis of large intestine without perforation or abscess without bleeding: Secondary | ICD-10-CM | POA: Diagnosis not present

## 2016-08-22 DIAGNOSIS — D124 Benign neoplasm of descending colon: Secondary | ICD-10-CM | POA: Insufficient documentation

## 2016-08-22 DIAGNOSIS — N189 Chronic kidney disease, unspecified: Secondary | ICD-10-CM | POA: Diagnosis not present

## 2016-08-22 DIAGNOSIS — Z86010 Personal history of colonic polyps: Secondary | ICD-10-CM | POA: Insufficient documentation

## 2016-08-22 DIAGNOSIS — I129 Hypertensive chronic kidney disease with stage 1 through stage 4 chronic kidney disease, or unspecified chronic kidney disease: Secondary | ICD-10-CM | POA: Insufficient documentation

## 2016-08-22 DIAGNOSIS — D123 Benign neoplasm of transverse colon: Secondary | ICD-10-CM | POA: Diagnosis not present

## 2016-08-22 DIAGNOSIS — D122 Benign neoplasm of ascending colon: Secondary | ICD-10-CM | POA: Diagnosis not present

## 2016-08-22 DIAGNOSIS — E785 Hyperlipidemia, unspecified: Secondary | ICD-10-CM | POA: Insufficient documentation

## 2016-08-22 DIAGNOSIS — Z8719 Personal history of other diseases of the digestive system: Secondary | ICD-10-CM | POA: Insufficient documentation

## 2016-08-22 DIAGNOSIS — N400 Enlarged prostate without lower urinary tract symptoms: Secondary | ICD-10-CM | POA: Diagnosis not present

## 2016-08-22 HISTORY — DX: Chronic kidney disease, unspecified: N18.9

## 2016-08-22 HISTORY — PX: COLONOSCOPY: SHX5424

## 2016-08-22 HISTORY — DX: Gastric ulcer, unspecified as acute or chronic, without hemorrhage or perforation: K25.9

## 2016-08-22 SURGERY — COLONOSCOPY
Anesthesia: General

## 2016-08-22 MED ORDER — SODIUM CHLORIDE 0.9 % IV SOLN
INTRAVENOUS | Status: DC
Start: 2016-08-22 — End: 2016-08-22
  Administered 2016-08-22: 08:00:00 via INTRAVENOUS
  Administered 2016-08-22: 1000 mL via INTRAVENOUS

## 2016-08-22 MED ORDER — PROPOFOL 10 MG/ML IV BOLUS
INTRAVENOUS | Status: AC
  Filled 2016-08-22: qty 20

## 2016-08-22 MED ORDER — SODIUM CHLORIDE 0.9 % IV SOLN: INTRAVENOUS | Status: DC

## 2016-08-22 MED ORDER — FENTANYL CITRATE (PF) 100 MCG/2ML IJ SOLN
INTRAMUSCULAR | Status: DC | PRN
  Administered 2016-08-22: 50 ug via INTRAVENOUS

## 2016-08-22 MED ORDER — PROPOFOL 500 MG/50ML IV EMUL
INTRAVENOUS | Status: DC | PRN
  Administered 2016-08-22: 200 ug/kg/min via INTRAVENOUS

## 2016-08-22 MED ORDER — FENTANYL CITRATE (PF) 100 MCG/2ML IJ SOLN
INTRAMUSCULAR | Status: AC
  Filled 2016-08-22: qty 2

## 2016-08-22 NOTE — Op Note (Signed)
[facility] Gastroenterology Patient Name: Victor Walsh Procedure Date: 08/22/2016 7:32 AM MRN: [id number] Account #: [id number] Date of Birth: 09/03/1935 Admit Type: Outpatient Age: 81 Room: [facility] ENDO ROOM 3 Gender: Male Note Status: Finalized Procedure:            Colonoscopy Indications:          Personal history of colonic polyps Providers:            Lollie Sails, MD Referring MD:         Juline Patch, MD (Referring MD) Medicines:            Monitored Anesthesia Care Complications:        No immediate complications. Procedure:            Pre-Anesthesia Assessment:                       - ASA Grade Assessment: III - A patient with severe                        systemic disease.                       After obtaining informed consent, the colonoscope was                        passed under direct vision. Throughout the procedure,                        the patient's blood pressure, pulse, and oxygen                        saturations were monitored continuously. The                        Colonoscope was introduced through the anus and                        advanced to the the cecum, identified by appendiceal                        orifice and ileocecal valve. The colonoscopy was                        performed without difficulty. The patient tolerated the                        procedure well. The quality of the bowel preparation                        was good. Findings:      Multiple small-mouthed diverticula were found in the sigmoid colon and       descending colon.      A 2 mm polyp was found in the proximal ascending colon. The polyp was       flat. The polyp was removed with a cold biopsy forceps. Resection and       retrieval were complete.      A 3 mm polyp was found in the proximal descending colon. The polyp was       flat. The polyp was removed with a cold biopsy forceps. Resection and  retrieval were complete.      A 2 mm polyp was  found in the distal descending colon. The polyp was       flat. The polyp was removed with a cold biopsy forceps. Resection and       retrieval were complete.      The exam was otherwise without abnormality.      The retroflexed view of the distal rectum and anal verge was normal and       showed no anal or rectal abnormalities.      The digital rectal exam was normal. Impression:           - Diverticulosis in the sigmoid colon and in the                        descending colon.                       - One 2 mm polyp in the proximal ascending colon,                        removed with a cold biopsy forceps. Resected and                        retrieved.                       - One 3 mm polyp in the proximal descending colon,                        removed with a cold biopsy forceps. Resected and                        retrieved.                       - One 2 mm polyp in the distal descending colon,                        removed with a cold biopsy forceps. Resected and                        retrieved.                       - The examination was otherwise normal.                       - The distal rectum and anal verge are normal on                        retroflexion view. Recommendation:       - Discharge patient to home.                       - Telephone GI clinic for pathology results in 1 week. Lollie Sails, MD 08/22/2016 8:16:01 AM This report has been signed electronically. Number of Addenda: 0 Note Initiated On: 08/22/2016 7:32 AM Scope Withdrawal Time: 0 hours 18 minutes 31 seconds  Total Procedure Duration: 0 hours 28 minutes 38 seconds       [facility]

## 2016-08-22 NOTE — H&P (Signed)
Outpatient short stay form Pre-procedure 08/22/2016 7:33 AM Victor Sails MD  Primary Physician: Dr. Otilio Miu  Reason for visit:  Colonoscopy  History of present illness:  Patient is a 81 year old male presenting today as above. He has a personal history of adenomatous colon polyps. He takes no aspirin or blood thinning agents. He tolerated his prep well.    Current Facility-Administered Medications:  .  0.9 %  sodium chloride infusion, , Intravenous, Continuous, Victor Sails, MD, Last Rate: 20 mL/hr at 08/22/16 0701, 1,000 mL at 08/22/16 0701 .  0.9 %  sodium chloride infusion, , Intravenous, Continuous, Victor Sails, MD  Prescriptions Prior to Admission  Medication Sig Dispense Refill Last Dose  . amLODipine (NORVASC) 5 MG tablet Take 1 tablet (5 mg total) by mouth daily. 30 tablet 6 Past Week at Unknown time  . CRESTOR 10 MG tablet Take 1 tablet (10 mg total) by mouth daily. 30 tablet 6 Past Week at Unknown time  . dutasteride (AVODART) 0.5 MG capsule Take 1 capsule (0.5 mg total) by mouth daily. 30 capsule 6 Past Week at Unknown time  . Vitamin D, Ergocalciferol, (DRISDOL) 50000 units CAPS capsule Take 1 capsule (50,000 Units total) by mouth every 30 (thirty) days. 30 capsule 11 Past Week at Unknown time     No Known Allergies   Past Medical History:  Diagnosis Date  . BPH (benign prostatic hyperplasia)   . Chronic kidney disease   . Hyperlipidemia   . Hypertension   . Stomach ulcer     Review of systems:      Physical Exam    Heart and lungs: Regular rate and rhythm without rub or gallop, lungs are bilaterally clear.    HEENT: Normocephalic atraumatic eyes are anicteric    Other:     Pertinant exam for procedure: Soft nontender nondistended bowel sounds positive normoactive.    Planned proceedures: Colonoscopy and indicated procedures. I have discussed the risks benefits and complications of procedures to include not limited to bleeding,  infection, perforation and the risk of sedation and the patient wishes to proceed.    Victor Sails, MD Gastroenterology 08/22/2016  7:33 AM

## 2016-08-22 NOTE — Anesthesia Preprocedure Evaluation (Signed)
Anesthesia Evaluation  Patient identified by MRN, date of birth, ID band Patient awake    Reviewed: Allergy & Precautions, NPO status , Patient's Chart, lab work & pertinent test results, reviewed documented beta blocker date and time   Airway Mallampati: II  TM Distance: >3 FB     Dental  (+) Chipped   Pulmonary           Cardiovascular hypertension, Pt. on medications      Neuro/Psych    GI/Hepatic PUD,   Endo/Other    Renal/GU Renal disease     Musculoskeletal   Abdominal   Peds  Hematology   Anesthesia Other Findings   Reproductive/Obstetrics                             Anesthesia Physical Anesthesia Plan  ASA: III  Anesthesia Plan: General   Post-op Pain Management:    Induction: Intravenous  Airway Management Planned:   Additional Equipment:   Intra-op Plan:   Post-operative Plan:   Informed Consent: I have reviewed the patients History and Physical, chart, labs and discussed the procedure including the risks, benefits and alternatives for the proposed anesthesia with the patient or authorized representative who has indicated his/her understanding and acceptance.     Plan Discussed with: CRNA  Anesthesia Plan Comments:         Anesthesia Quick Evaluation

## 2016-08-22 NOTE — Anesthesia Postprocedure Evaluation (Signed)
Anesthesia Post Note  Patient: Victor Walsh  Procedure(s) Performed: Procedure(s) (LRB): COLONOSCOPY (N/A)  Patient location during evaluation: Endoscopy Anesthesia Type: General Level of consciousness: awake and alert Pain management: pain level controlled Vital Signs Assessment: post-procedure vital signs reviewed and stable Respiratory status: spontaneous breathing, nonlabored ventilation, respiratory function stable and patient connected to nasal cannula oxygen Cardiovascular status: blood pressure returned to baseline and stable Postop Assessment: no signs of nausea or vomiting Anesthetic complications: no     Last Vitals:  Vitals:   08/22/16 0850 08/22/16 0859  BP: 134/88 (!) 146/90  Pulse: 63 62  Resp: 18 18  Temp:      Last Pain:  Vitals:   08/22/16 0821  TempSrc: Tympanic                 Blyss Lugar S

## 2016-08-22 NOTE — Anesthesia Procedure Notes (Signed)
Date/Time: 08/22/2016 7:35 AM Performed by: Allean Found Pre-anesthesia Checklist: Patient identified, Emergency Drugs available, Suction available, Patient being monitored and Timeout performed Patient Re-evaluated:Patient Re-evaluated prior to inductionOxygen Delivery Method: Nasal cannula Intubation Type: IV induction Placement Confirmation: positive ETCO2

## 2016-08-22 NOTE — Transfer of Care (Signed)
Immediate Anesthesia Transfer of Care Note  Patient: Victor Walsh  Procedure(s) Performed: Procedure(s): COLONOSCOPY (N/A)  Patient Location: PACU  Anesthesia Type:General  Level of Consciousness: sedated  Airway & Oxygen Therapy: Patient Spontanous Breathing and Patient connected to nasal cannula oxygen  Post-op Assessment: Report given to RN and Post -op Vital signs reviewed and stable  Post vital signs: Reviewed and stable  Last Vitals:  Vitals:   08/22/16 0646  BP: (!) 154/94  Pulse: 69  Resp: 16  Temp: 36.3 C    Last Pain:  Vitals:   08/22/16 0646  TempSrc: Tympanic         Complications: No apparent anesthesia complications

## 2016-08-22 NOTE — Anesthesia Post-op Follow-up Note (Cosign Needed)
Anesthesia QCDR form completed.        

## 2016-08-23 ENCOUNTER — Encounter: Payer: Self-pay | Admitting: Gastroenterology

## 2016-08-23 LAB — SURGICAL PATHOLOGY

## 2016-10-16 ENCOUNTER — Other Ambulatory Visit: Payer: Self-pay | Admitting: Family Medicine

## 2016-10-16 DIAGNOSIS — I10 Essential (primary) hypertension: Secondary | ICD-10-CM

## 2016-10-17 ENCOUNTER — Other Ambulatory Visit: Payer: Self-pay | Admitting: Family Medicine

## 2016-10-17 DIAGNOSIS — N400 Enlarged prostate without lower urinary tract symptoms: Secondary | ICD-10-CM

## 2016-10-19 ENCOUNTER — Other Ambulatory Visit: Payer: Self-pay

## 2016-10-23 ENCOUNTER — Encounter: Payer: Self-pay | Admitting: Family Medicine

## 2016-10-23 ENCOUNTER — Ambulatory Visit (INDEPENDENT_AMBULATORY_CARE_PROVIDER_SITE_OTHER): Payer: Medicare Other | Admitting: Family Medicine

## 2016-10-23 DIAGNOSIS — I10 Essential (primary) hypertension: Secondary | ICD-10-CM

## 2016-10-23 DIAGNOSIS — N401 Enlarged prostate with lower urinary tract symptoms: Secondary | ICD-10-CM | POA: Diagnosis not present

## 2016-10-23 DIAGNOSIS — R3912 Poor urinary stream: Secondary | ICD-10-CM | POA: Diagnosis not present

## 2016-10-23 DIAGNOSIS — E782 Mixed hyperlipidemia: Secondary | ICD-10-CM | POA: Diagnosis not present

## 2016-10-23 MED ORDER — AMLODIPINE BESYLATE 5 MG PO TABS: ORAL_TABLET | ORAL | 11 refills | Status: DC

## 2016-10-23 MED ORDER — DUTASTERIDE 0.5 MG PO CAPS: ORAL_CAPSULE | ORAL | 11 refills | Status: DC

## 2016-10-23 MED ORDER — CRESTOR 10 MG PO TABS: 10.0000 mg | ORAL_TABLET | Freq: Every day | ORAL | 11 refills | Status: DC

## 2016-10-23 NOTE — Progress Notes (Signed)
Name: Victor Walsh   MRN: [id number]    DOB: [birth year]   Date:10/23/2016       Progress Note  Subjective  Chief Complaint  Chief Complaint  Patient presents with  . Hypertension  . Hyperlipidemia  . Benign Prostatic Hypertrophy    Hypertension  This is a chronic problem. The current episode started more than 1 year ago. The problem is unchanged. The problem is controlled. Pertinent negatives include no anxiety, blurred vision, chest pain, headaches, malaise/fatigue, neck pain, orthopnea, palpitations, peripheral edema, PND, shortness of breath or sweats. There are no associated agents to hypertension. Risk factors for coronary artery disease include dyslipidemia. Past treatments include calcium channel blockers. The current treatment provides moderate improvement. There are no compliance problems.  There is no history of angina, kidney disease, CAD/MI, CVA, heart failure, left ventricular hypertrophy, PVD or retinopathy. There is no history of chronic renal disease, a hypertension causing med or renovascular disease.  Hyperlipidemia  This is a chronic problem. The current episode started more than 1 year ago. The problem is controlled. Recent lipid tests were reviewed and are normal. He has no history of chronic renal disease, diabetes, hypothyroidism, liver disease, obesity or nephrotic syndrome. Pertinent negatives include no chest pain, focal weakness, myalgias or shortness of breath. Current antihyperlipidemic treatment includes statins. The current treatment provides moderate improvement of lipids. There are no compliance problems.  Risk factors for coronary artery disease include hypertension and dyslipidemia.  Benign Prostatic Hypertrophy  This is a chronic problem. The current episode started more than 1 year ago. The problem is unchanged. Irritative symptoms do not include frequency, nocturia or urgency. Obstructive symptoms include dribbling, a slower stream and a weak stream. Obstructive  symptoms do not include incomplete emptying or straining. Pertinent negatives include no chills, dysuria, genital pain, hematuria, hesitancy, nausea or vomiting. The treatment provided moderate relief.    No problem-specific Assessment & Plan notes found for this encounter.   Past Medical History:  Diagnosis Date  . BPH (benign prostatic hyperplasia)   . Chronic kidney disease   . Hyperlipidemia   . Hypertension   . Stomach ulcer     Past Surgical History:  Procedure Laterality Date  . COLONOSCOPY  2013   cleared- Dr Gustavo Lah  . COLONOSCOPY N/A 08/22/2016   Procedure: COLONOSCOPY;  Surgeon: Lollie Sails, MD;  Location: [facility] ENDOSCOPY;  Service: Endoscopy;  Laterality: N/A;  . COLONOSCOPY W/ POLYPECTOMY    . HERNIA REPAIR      Family History  Problem Relation Age of Onset  . Kidney disease Mother     Social History   Social History  . Marital status: Single    Spouse name: N/A  . Number of children: N/A  . Years of education: N/A   Occupational History  . Not on file.   Social History Main Topics  . Smoking status: Never Smoker  . Smokeless tobacco: Never Used  . Alcohol use No  . Drug use: No  . Sexual activity: No   Other Topics Concern  . Not on file   Social History Narrative  . No narrative on file    No Known Allergies  Outpatient Medications Prior to Visit  Medication Sig Dispense Refill  . Vitamin D, Ergocalciferol, (DRISDOL) 50000 units CAPS capsule Take 1 capsule (50,000 Units total) by mouth every 30 (thirty) days. 30 capsule 11  . amLODipine (NORVASC) 5 MG tablet TAKE 1 TABLET(5 MG) BY MOUTH DAILY 30 tablet 0  . CRESTOR  10 MG tablet Take 1 tablet (10 mg total) by mouth daily. 30 tablet 6  . dutasteride (AVODART) 0.5 MG capsule TAKE 1 CAPSULE(0.5 MG) BY MOUTH DAILY 15 capsule 0   No facility-administered medications prior to visit.     Review of Systems  Constitutional: Negative for chills, fever, malaise/fatigue and weight loss.   HENT: Negative for ear discharge, ear pain and sore throat.   Eyes: Negative for blurred vision.  Respiratory: Negative for cough, sputum production, shortness of breath and wheezing.   Cardiovascular: Negative for chest pain, palpitations, orthopnea, leg swelling and PND.  Gastrointestinal: Negative for abdominal pain, blood in stool, constipation, diarrhea, heartburn, melena, nausea and vomiting.  Genitourinary: Negative for dysuria, frequency, hematuria, hesitancy, incomplete emptying, nocturia and urgency.  Musculoskeletal: Negative for back pain, joint pain, myalgias and neck pain.  Skin: Negative for rash.  Neurological: Negative for dizziness, tingling, sensory change, focal weakness and headaches.  Endo/Heme/Allergies: Negative for environmental allergies and polydipsia. Does not bruise/bleed easily.  Psychiatric/Behavioral: Negative for depression and suicidal ideas. The patient is not nervous/anxious and does not have insomnia.      Objective  Vitals:   10/23/16 0914  BP: 120/70  Pulse: 64  Weight: 197 lb (89.4 kg)  Height: 5\' 9"  (1.753 m)    Physical Exam  Constitutional: He is oriented to person, place, and time and well-developed, well-nourished, and in no distress.  HENT:  Head: Normocephalic.  Right Ear: External ear normal.  Left Ear: External ear normal.  Nose: Nose normal.  Mouth/Throat: Oropharynx is clear and moist.  Eyes: Conjunctivae and EOM are normal. Pupils are equal, round, and reactive to light. Right eye exhibits no discharge. Left eye exhibits no discharge. No scleral icterus.  Neck: Normal range of motion. Neck supple. No JVD present. No tracheal deviation present. No thyromegaly present.  Cardiovascular: Normal rate, regular rhythm, normal heart sounds and intact distal pulses.  Exam reveals no gallop and no friction rub.   No murmur heard. Pulmonary/Chest: Breath sounds normal. No respiratory distress. He has no wheezes. He has no rales.   Abdominal: Soft. Bowel sounds are normal. He exhibits no mass. There is no hepatosplenomegaly. There is no tenderness. There is no rebound, no guarding and no CVA tenderness.  Genitourinary: Rectum normal and prostate normal. Prostate is not enlarged and not tender.  Musculoskeletal: Normal range of motion. He exhibits no edema or tenderness.  Lymphadenopathy:    He has no cervical adenopathy.  Neurological: He is alert and oriented to person, place, and time. He has normal sensation, normal strength, normal reflexes and intact cranial nerves. No cranial nerve deficit.  Skin: Skin is warm. No rash noted.  Psychiatric: Mood and affect normal.  Nursing note and vitals reviewed.     Assessment & Plan  Problem List Items Addressed This Visit      Cardiovascular and Mediastinum   Essential hypertension   Relevant Medications   CRESTOR 10 MG tablet   amLODipine (NORVASC) 5 MG tablet   Other Relevant Orders   Renal Function Panel     Musculoskeletal and Integument   Hyperlipidemia type III   Relevant Medications   CRESTOR 10 MG tablet   Other Relevant Orders   Lipid Profile    Other Visit Diagnoses    Benign prostatic hyperplasia with weak urinary stream       Relevant Medications   dutasteride (AVODART) 0.5 MG capsule   Other Relevant Orders   PSA      Meds  ordered this encounter  Medications  . CRESTOR 10 MG tablet    Sig: Take 1 tablet (10 mg total) by mouth daily.    Dispense:  30 tablet    Refill:  11  . amLODipine (NORVASC) 5 MG tablet    Sig: TAKE 1 TABLET(5 MG) BY MOUTH DAILY    Dispense:  30 tablet    Refill:  11    sched appt  . dutasteride (AVODART) 0.5 MG capsule    Sig: TAKE 1 CAPSULE(0.5 MG) BY MOUTH DAILY    Dispense:  30 capsule    Refill:  11    Hasn't been seen in a year- sched appt for "med refills"      Dr. Mac[facility]  10/23/16

## 2016-10-24 ENCOUNTER — Other Ambulatory Visit: Payer: Self-pay

## 2016-10-24 LAB — LIPID PANEL
CHOL/HDL RATIO: 3.9 ratio (ref 0.0–5.0)
CHOLESTEROL TOTAL: 207 mg/dL — AB (ref 100–199)
HDL: 53 mg/dL (ref >39)
LDL CALC: 131 mg/dL — AB (ref 0–99)
TRIGLYCERIDES: 115 mg/dL (ref 0–149)
VLDL CHOLESTEROL CAL: 23 mg/dL (ref 5–40)

## 2016-10-24 LAB — RENAL FUNCTION PANEL
Albumin: 4.2 g/dL (ref 3.5–4.8)
BUN/Creatinine Ratio: 12 (ref 10–24)
BUN: 17 mg/dL (ref 8–27)
CALCIUM: 9.1 mg/dL (ref 8.6–10.2)
CO2: 24 mmol/L (ref 18–29)
CREATININE: 1.43 mg/dL — AB (ref 0.76–1.27)
Chloride: 105 mmol/L (ref 96–106)
GFR calc Af Amer: 54 mL/min/{1.73_m2} — ABNORMAL LOW (ref >59)
GFR, EST NON AFRICAN AMERICAN: 47 mL/min/{1.73_m2} — AB (ref >59)
Glucose: 95 mg/dL (ref 65–99)
Phosphorus: 2.1 mg/dL — ABNORMAL LOW (ref 2.5–4.5)
Potassium: 4.9 mmol/L (ref 3.5–5.2)
Sodium: 142 mmol/L (ref 134–144)

## 2016-10-24 LAB — PSA: Prostate Specific Ag, Serum: 1.7 ng/mL (ref 0.0–4.0)

## 2016-12-18 ENCOUNTER — Other Ambulatory Visit: Payer: Self-pay

## 2017-07-11 ENCOUNTER — Telehealth: Payer: Self-pay

## 2017-07-11 NOTE — Telephone Encounter (Signed)
Make sure to call his cell- he works at a [employer], stays gone from home

## 2017-07-11 NOTE — Telephone Encounter (Signed)
Called pt to sched AWV w/ [facility]. LVM requesting returned call.  

## 2017-07-12 ENCOUNTER — Telehealth: Payer: Self-pay

## 2017-07-12 NOTE — Telephone Encounter (Signed)
Called pt to sched AWV w/ [facility]. LVM requesting returned call.  

## 2017-07-12 NOTE — Telephone Encounter (Signed)
Pt set up for 07/18/17

## 2017-07-18 ENCOUNTER — Ambulatory Visit: Payer: Medicare Other

## 2017-09-26 ENCOUNTER — Ambulatory Visit (INDEPENDENT_AMBULATORY_CARE_PROVIDER_SITE_OTHER): Payer: Medicare Other

## 2017-09-26 VITALS — BP 120/70 | HR 82 | Temp 98.5°F | Resp 12 | Ht 69.0 in | Wt 191.2 lb

## 2017-09-26 DIAGNOSIS — Z0000 Encounter for general adult medical examination without abnormal findings: Secondary | ICD-10-CM

## 2017-09-26 NOTE — Patient Instructions (Signed)
[name] , Thank you for taking time to come for your Medicare Wellness Visit. I appreciate your ongoing commitment to your health goals. Please review the following plan we discussed and let me know if I can assist you in the future.   Screening recommendations/referrals:  Colorectal Screening: No longer required  Vision and Dental Exams: Recommended annual ophthalmology exams for early detection of glaucoma and other disorders of the eye Recommended annual dental exams for proper oral hygiene  Vaccinations: Influenza vaccine: Overdue Pneumococcal vaccine: Completed series Tdap vaccine: Up to date Shingles vaccine: Please call your insurance company to determine your out of pocket expense for the Shingrix vaccine. You may also receive this vaccine at your local pharmacy or Health Dept.  Advanced directives: Advance directive discussed with you today. I have provided a copy for you to complete at home and have notarized. Once this is complete please bring a copy in to our office so we can scan it into your chart.  Conditions/risks identified: Recommend to drink at least 6-8 8oz glasses of water per day.  Next appointment: Please schedule your Annual Wellness Visit with your Nurse Health Advisor in one year.  Please schedule an appointment with Dr. Ronnald Walsh within the next 30 days for follow up after today's Annual Wellness Visit.  Preventive Care 80 Years and Older, Male Preventive care refers to lifestyle choices and visits with your health care provider that can promote health and wellness. What does preventive care include?  A yearly physical exam. This is also called an annual well check.  Dental exams once or twice a year.  Routine eye exams. Ask your health care provider how often you should have your eyes checked.  Personal lifestyle choices, including:  Daily care of your teeth and gums.  Regular physical activity.  Eating a healthy diet.  Avoiding tobacco and drug  use.  Limiting alcohol use.  Practicing safe sex.  Taking low doses of aspirin every day.  Taking vitamin and mineral supplements as recommended by your health care provider. What happens during an annual well check? The services and screenings done by your health care provider during your annual well check will depend on your age, overall health, lifestyle risk factors, and family history of disease. Counseling  Your health care provider may ask you questions about your:  Alcohol use.  Tobacco use.  Drug use.  Emotional well-being.  Home and relationship well-being.  Sexual activity.  Eating habits.  History of falls.  Memory and ability to understand (cognition).  Work and work Statistician. Screening  You may have the following tests or measurements:  Height, weight, and BMI.  Blood pressure.  Lipid and cholesterol levels. These may be checked every 5 years, or more frequently if you are over 80 years old.  Skin check.  Lung cancer screening. You may have this screening every year starting at age 80 if you have a 30-pack-year history of smoking and currently smoke or have quit within the past 15 years.  Fecal occult blood test (FOBT) of the stool. You may have this test every year starting at age 80.  Flexible sigmoidoscopy or colonoscopy. You may have a sigmoidoscopy every 5 years or a colonoscopy every 10 years starting at age 80.  Prostate cancer screening. Recommendations will vary depending on your family history and other risks.  Hepatitis C blood test.  Hepatitis B blood test.  Sexually transmitted disease (STD) testing.  Diabetes screening. This is done by checking your blood sugar (  glucose) after you have not eaten for a while (fasting). You may have this done every 1-3 years.  Abdominal aortic aneurysm (AAA) screening. You may need this if you are a current or former smoker.  Osteoporosis. You may be screened starting at age 80 if you are at  high risk. Talk with your health care provider about your test results, treatment options, and if necessary, the need for more tests. Vaccines  Your health care provider may recommend certain vaccines, such as:  Influenza vaccine. This is recommended every year.  Tetanus, diphtheria, and acellular pertussis (Tdap, Td) vaccine. You may need a Td booster every 10 years.  Zoster vaccine. You may need this after age 80.  Pneumococcal 13-valent conjugate (PCV13) vaccine. One dose is recommended after age 80.  Pneumococcal polysaccharide (PPSV23) vaccine. One dose is recommended after age 80. Talk to your health care provider about which screenings and vaccines you need and how often you need them. This information is not intended to replace advice given to you by your health care provider. Make sure you discuss any questions you have with your health care provider. Document Released: 06/04/2015 Document Revised: 01/26/2016 Document Reviewed: 03/09/2015 Elsevier Interactive Patient Education  2017 [place] Prevention in the Home Falls can cause injuries. They can happen to people of all ages. There are many things you can do to make your home safe and to help prevent falls. What can I do on the outside of my home?  Regularly fix the edges of walkways and driveways and fix any cracks.  Remove anything that might make you trip as you walk through a door, such as a raised step or threshold.  Trim any bushes or trees on the path to your home.  Use bright outdoor lighting.  Clear any walking paths of anything that might make someone trip, such as rocks or tools.  Regularly check to see if handrails are loose or broken. Make sure that both sides of any steps have handrails.  Any raised decks and porches should have guardrails on the edges.  Have any leaves, snow, or ice cleared regularly.  Use sand or salt on walking paths during winter.  Clean up any spills in your garage  right away. This includes oil or grease spills. What can I do in the bathroom?  Use night lights.  Install grab bars by the toilet and in the tub and shower. Do not use towel bars as grab bars.  Use non-skid mats or decals in the tub or shower.  If you need to sit down in the shower, use a plastic, non-slip stool.  Keep the floor dry. Clean up any water that spills on the floor as soon as it happens.  Remove soap buildup in the tub or shower regularly.  Attach bath mats securely with double-sided non-slip rug tape.  Do not have throw rugs and other things on the floor that can make you trip. What can I do in the bedroom?  Use night lights.  Make sure that you have a light by your bed that is easy to reach.  Do not use any sheets or blankets that are too big for your bed. They should not hang down onto the floor.  Have a firm chair that has side arms. You can use this for support while you get dressed.  Do not have throw rugs and other things on the floor that can make you trip. What can I do in the kitchen?  Clean up any spills right away.  Avoid walking on wet floors.  Keep items that you use a lot in easy-to-reach places.  If you need to reach something above you, use a strong step stool that has a grab bar.  Keep electrical cords out of the way.  Do not use floor polish or wax that makes floors slippery. If you must use wax, use non-skid floor wax.  Do not have throw rugs and other things on the floor that can make you trip. What can I do with my stairs?  Do not leave any items on the stairs.  Make sure that there are handrails on both sides of the stairs and use them. Fix handrails that are broken or loose. Make sure that handrails are as long as the stairways.  Check any carpeting to make sure that it is firmly attached to the stairs. Fix any carpet that is loose or worn.  Avoid having throw rugs at the top or bottom of the stairs. If you do have throw rugs,  attach them to the floor with carpet tape.  Make sure that you have a light switch at the top of the stairs and the bottom of the stairs. If you do not have them, ask someone to add them for you. What else can I do to help prevent falls?  Wear shoes that:  Do not have high heels.  Have rubber bottoms.  Are comfortable and fit you well.  Are closed at the toe. Do not wear sandals.  If you use a stepladder:  Make sure that it is fully opened. Do not climb a closed stepladder.  Make sure that both sides of the stepladder are locked into place.  Ask someone to hold it for you, if possible.  Clearly mark and make sure that you can see:  Any grab bars or handrails.  First and last steps.  Where the edge of each step is.  Use tools that help you move around (mobility aids) if they are needed. These include:  Canes.  Walkers.  Scooters.  Crutches.  Turn on the lights when you go into a dark area. Replace any light bulbs as soon as they burn out.  Set up your furniture so you have a clear path. Avoid moving your furniture around.  If any of your floors are uneven, fix them.  If there are any pets around you, be aware of where they are.  Review your medicines with your doctor. Some medicines can make you feel dizzy. This can increase your chance of falling. Ask your doctor what other things that you can do to help prevent falls. This information is not intended to replace advice given to you by your health care provider. Make sure you discuss any questions you have with your health care provider. Document Released: 03/04/2009 Document Revised: 10/14/2015 Document Reviewed: 06/12/2014 Elsevier Interactive Patient Education  2017 [employer].

## 2017-09-26 NOTE — Progress Notes (Signed)
Subjective:   Victor Walsh is a 82 y.o. male who presents for Medicare Annual/Subsequent preventive examination.  Review of Systems:  N/A Cardiac Risk Factors include: advanced age (>44men, >55 women);male gender;dyslipidemia;hypertension;sedentary lifestyle     Objective:    Vitals: BP 120/70 (BP Location: Right Arm, Patient Position: Sitting, Cuff Size: Normal)   Pulse 82   Temp 98.5 F (36.9 C) (Oral)   Resp 12   Ht 5\' 9"  (1.753 m)   Wt 191 lb 3.2 oz (86.7 kg)   SpO2 92%   BMI 28.24 kg/m   Body mass index is 28.24 kg/m.  Advanced Directives 09/26/2017 08/22/2016 02/09/2015  Does Patient Have a Medical Advance Directive? No No Yes  Type of Advance Directive - - Living will;Healthcare Power of [place] in Chart? - - No - copy requested  Would patient like information on creating a medical advance directive? Yes (MAU/Ambulatory/Procedural Areas - Information given) - -    Tobacco Social History   Tobacco Use  Smoking Status Never Smoker  Smokeless Tobacco Never Used  Tobacco Comment   smoking cessation materials not required     Counseling given: No Comment: smoking cessation materials not required  Clinical Intake:  Pre-visit preparation completed: Yes  Pain : No/denies pain   BMI - recorded: 28.24 Nutritional Status: BMI 25 -29 Overweight Nutritional Risks: None Diabetes: No  How often do you need to have someone help you when you read instructions, pamphlets, or other written materials from your doctor or pharmacy?: 1 - Never  Interpreter Needed?: No  Information entered by :: AEversole, LPN  Past Medical History:  Diagnosis Date  . BPH (benign prostatic hyperplasia)   . Chronic kidney disease   . Hyperlipidemia   . Hypertension   . Stomach ulcer    Past Surgical History:  Procedure Laterality Date  . COLONOSCOPY  2013   cleared- Dr Gustavo Lah  . COLONOSCOPY N/A 08/22/2016   Procedure: COLONOSCOPY;  Surgeon:  Lollie Sails, MD;  Location: [facility] ENDOSCOPY;  Service: Endoscopy;  Laterality: N/A;  . COLONOSCOPY W/ POLYPECTOMY    . HERNIA REPAIR     Family History  Problem Relation Age of Onset  . Kidney disease Mother   . Cancer Father        stomach cancer   Social History   Socioeconomic History  . Marital status: Divorced    Spouse name: Not on file  . Number of children: 1  . Years of education: Not on file  . Highest education level: Master's degree (e.g., MA, MS, MEng, MEd, MSW, MBA)  Occupational History    Employer: [employer]  Social Needs  . Financial resource strain: Not hard at all  . Food insecurity:    Worry: Never true    Inability: Never true  . Transportation needs:    Medical: No    Non-medical: No  Tobacco Use  . Smoking status: Never Smoker  . Smokeless tobacco: Never Used  . Tobacco comment: smoking cessation materials not required  Substance and Sexual Activity  . Alcohol use: No    Alcohol/week: 0.0 oz  . Drug use: No  . Sexual activity: Never  Lifestyle  . Physical activity:    Days per week: 0 days    Minutes per session: 0 min  . Stress: Not at all  Relationships  . Social connections:    Talks on phone: Patient refused    Gets together: Patient  refused    Attends religious service: Patient refused    Active member of club or organization: Patient refused    Attends meetings of clubs or organizations: Patient refused    Relationship status: Divorced  Other Topics Concern  . Not on file  Social History Narrative  . Not on file    Outpatient Encounter Medications as of 09/26/2017  Medication Sig  . amLODipine (NORVASC) 5 MG tablet TAKE 1 TABLET(5 MG) BY MOUTH DAILY  . CRESTOR 10 MG tablet Take 1 tablet (10 mg total) by mouth daily.  Marland Kitchen dutasteride (AVODART) 0.5 MG capsule TAKE 1 CAPSULE(0.5 MG) BY MOUTH DAILY  . Vitamin D, Ergocalciferol, (DRISDOL) 50000 units CAPS capsule Take 1 capsule (50,000 Units total) by mouth every 30  (thirty) days.   No facility-administered encounter medications on file as of 09/26/2017.     Activities of Daily Living In your present state of health, do you have any difficulty performing the following activities: 09/26/2017  Hearing? N  Comment denies hearing aids  Vision? N  Comment wears eyeglasses  Difficulty concentrating or making decisions? N  Walking or climbing stairs? N  Dressing or bathing? N  Doing errands, shopping? N  Preparing Food and eating ? N  Comment denies dentures  Using the Toilet? N  In the past six months, have you accidently leaked urine? N  Do you have problems with loss of bowel control? N  Managing your Medications? N  Managing your Finances? N  Housekeeping or managing your Housekeeping? N  Some recent data might be hidden    Patient Care Team: Juline Patch, MD as PCP - General (Family Medicine) Lollie Sails, MD as Consulting Physician (Gastroenterology)   Assessment:   This is a routine wellness examination for Victor Walsh.  Exercise Activities and Dietary recommendations Current Exercise Habits: The patient does not participate in regular exercise at present, Exercise limited by: None identified  Goals    . DIET - INCREASE WATER INTAKE     Recommend to drink at least 6-8 8oz glasses of water per day.       Fall Risk Fall Risk  09/26/2017 10/23/2016 09/14/2015  Falls in the past year? No Yes No  Number falls in past yr: - 1 -  Injury with Fall? - No -  Risk for fall due to : Impaired vision - -  Risk for fall due to: Comment wears eyeglasses - -  Follow up - Falls evaluation completed -   FALL RISK PREVENTION PERTAINING TO HOME: Is your home free of loose throw rugs in walkways, pet beds, electrical cords, etc? Yes Is there adequate lighting in your home to reduce risk of falls?  Yes Are there stairs in or around your home WITH handrails? Yes  ASSISTIVE DEVICES UTILIZED TO PREVENT FALLS: Use of a cane, walker or w/c? No Grab bars in  the bathroom? No  Shower chair or a place to sit while bathing? No An elevated toilet seat or a handicapped toilet? No  Timed Get Up and Go Performed: Yes. Pt ambulated 10 feet within 12 sec. Gait slow, steady and without the use of an assistive device. No intervention required at this time. Fall risk prevention has been discussed.  Community Resource Referral:  Pt declined my offer to send [employer] Referral to Care Guide for installation of grab bars in the shower, shower chair or an elevated toilet seat.  Depression Screen PHQ 2/9 Scores 09/26/2017 10/23/2016 09/14/2015  PHQ - 2  Score 0 0 0  PHQ- 9 Score 0 - -    Cognitive Function     6CIT Screen 09/26/2017  What Year? 0 points  What month? 0 points  What time? 0 points  Count back from 20 0 points  Months in reverse 0 points  Repeat phrase 2 points  Total Score 2    Immunization History  Administered Date(s) Administered  . Influenza Split 02/09/2015  . Influenza, High Dose Seasonal PF 02/21/2017  . Influenza,inj,Quad PF,6+ Mos 02/09/2015  . Pneumococcal Conjugate-13 02/09/2015  . Pneumococcal Polysaccharide-23 05/22/2006  . Tdap 09/14/2015    Qualifies for Shingles Vaccine? Yes. Due for Shingrix. Education has been provided regarding the importance of this vaccine. Pt has been advised to call his insurance company to determine his out of pocket expense. Advised he may also receive this vaccine at his local pharmacy or Health Dept. Verbalized acceptance and understanding.  Screening Tests Health Maintenance  Topic Date Due  . INFLUENZA VACCINE  12/20/2017  . TETANUS/TDAP  09/13/2025  . PNA vac Low Risk Adult  Completed   Cancer Screenings: Lung: Low Dose CT Chest recommended if Age 66-80 years, 30 pack-year currently smoking OR have quit w/in 15years. Patient does not qualify. Colorectal: No longer required  Additional Screenings: Hepatitis C Screening: Does not qualify     Plan:  I have personally  reviewed and addressed the Medicare Annual Wellness questionnaire and have noted the following in the patient's chart:  A. Medical and social history B. Use of alcohol, tobacco or illicit drugs  C. Current medications and supplements D. Functional ability and status E.  Nutritional status F.  Physical activity G. Advance directives H. List of other physicians I.  Hospitalizations, surgeries, and ER visits in previous 12 months J.  [place] such as hearing and vision if needed, cognitive and depression L. Referrals and appointments  In addition, I have reviewed and discussed with patient certain preventive protocols, quality metrics, and best practice recommendations. A written personalized care plan for preventive services as well as general preventive health recommendations were provided to patient.  Signed,  Aleatha Borer, LPN Nurse Health Advisor  MD Recommendations: Due for Shingrix. Education has been provided regarding the importance of this vaccine. Pt has been advised to call his insurance company to determine his out of pocket expense. Advised he may also receive this vaccine at his local pharmacy or Health Dept. Verbalized acceptance and understanding.

## 2017-10-04 ENCOUNTER — Ambulatory Visit: Payer: Medicare Other | Admitting: Family Medicine

## 2017-10-11 ENCOUNTER — Ambulatory Visit: Payer: Medicare Other | Admitting: Family Medicine

## 2017-10-16 ENCOUNTER — Ambulatory Visit: Payer: Medicare Other | Admitting: Family Medicine

## 2017-10-18 ENCOUNTER — Encounter: Payer: Self-pay | Admitting: Family Medicine

## 2017-10-18 ENCOUNTER — Ambulatory Visit: Payer: Medicare Other | Admitting: Family Medicine

## 2017-10-18 VITALS — BP 130/80 | HR 72 | Ht 69.0 in | Wt 198.0 lb

## 2017-10-18 DIAGNOSIS — N183 Chronic kidney disease, stage 3 unspecified: Secondary | ICD-10-CM

## 2017-10-18 DIAGNOSIS — R195 Other fecal abnormalities: Secondary | ICD-10-CM | POA: Diagnosis not present

## 2017-10-18 DIAGNOSIS — N401 Enlarged prostate with lower urinary tract symptoms: Secondary | ICD-10-CM | POA: Diagnosis not present

## 2017-10-18 DIAGNOSIS — R3912 Poor urinary stream: Secondary | ICD-10-CM

## 2017-10-18 DIAGNOSIS — I10 Essential (primary) hypertension: Secondary | ICD-10-CM

## 2017-10-18 DIAGNOSIS — E559 Vitamin D deficiency, unspecified: Secondary | ICD-10-CM | POA: Diagnosis not present

## 2017-10-18 DIAGNOSIS — E782 Mixed hyperlipidemia: Secondary | ICD-10-CM | POA: Diagnosis not present

## 2017-10-18 LAB — HEMOCCULT GUIAC POC 1CARD (OFFICE): FECAL OCCULT BLD: POSITIVE — AB

## 2017-10-18 MED ORDER — DUTASTERIDE 0.5 MG PO CAPS: ORAL_CAPSULE | ORAL | 2 refills | Status: DC

## 2017-10-18 MED ORDER — AMLODIPINE BESYLATE 5 MG PO TABS: ORAL_TABLET | ORAL | 2 refills | Status: DC

## 2017-10-18 MED ORDER — CRESTOR 10 MG PO TABS: 10.0000 mg | ORAL_TABLET | Freq: Every day | ORAL | 2 refills | Status: DC

## 2017-10-18 MED ORDER — VITAMIN D (ERGOCALCIFEROL) 1.25 MG (50000 UNIT) PO CAPS: 50000.0000 [IU] | ORAL_CAPSULE | ORAL | 11 refills | Status: DC

## 2017-10-18 NOTE — Progress Notes (Signed)
Name: Victor Walsh   MRN: [id number]    DOB: Feb 18, 1936   Date:10/18/2017       Progress Note  Subjective  Chief Complaint  Chief Complaint  Patient presents with  . Hypertension  . Hyperlipidemia    not fasting/ needs labs    Patient denies gastritis/gerd.  Patient is following for CKD  Hypertension  This is a chronic problem. The current episode started more than 1 year ago. The problem is unchanged. The problem is controlled. Pertinent negatives include no anxiety, blurred vision, headaches, malaise/fatigue, neck pain, orthopnea, palpitations, peripheral edema, PND, shortness of breath or sweats. There are no associated agents to hypertension. There are no known risk factors for coronary artery disease. Past treatments include calcium channel blockers. The current treatment provides moderate improvement. There are no compliance problems.  There is no history of angina, kidney disease, CAD/MI, CVA, heart failure, left ventricular hypertrophy, PVD or retinopathy. There is no history of chronic renal disease, a hypertension causing med or renovascular disease.  Hyperlipidemia  This is a chronic problem. The current episode started more than 1 year ago. The problem is controlled. Recent lipid tests were reviewed and are normal. He has no history of chronic renal disease or diabetes. There are no known factors aggravating his hyperlipidemia. Pertinent negatives include no focal sensory loss, focal weakness, leg pain, myalgias or shortness of breath. He is currently on no antihyperlipidemic treatment. The current treatment provides moderate improvement of lipids. There are no compliance problems.  Risk factors for coronary artery disease include dyslipidemia and hypertension.  Benign Prostatic Hypertrophy  This is a chronic problem. The current episode started more than 1 year ago. The problem is unchanged. Irritative symptoms do not include frequency or urgency. Obstructive symptoms include  dribbling. Obstructive symptoms do not include incomplete emptying, an intermittent stream, a slower stream, straining or a weak stream. Pertinent negatives include no chills, dysuria, hematuria or hesitancy. Past treatments include dutasteride. The treatment provided mild relief. He has been using treatment for 2 or more years.    No problem-specific Assessment & Plan notes found for this encounter.   Past Medical History:  Diagnosis Date  . BPH (benign prostatic hyperplasia)   . Chronic kidney disease   . Hyperlipidemia   . Hypertension   . Stomach ulcer     Past Surgical History:  Procedure Laterality Date  . COLONOSCOPY  2013   cleared- Dr Gustavo Lah  . COLONOSCOPY N/A 08/22/2016   Procedure: COLONOSCOPY;  Surgeon: Lollie Sails, MD;  Location: [facility] ENDOSCOPY;  Service: Endoscopy;  Laterality: N/A;  . COLONOSCOPY W/ POLYPECTOMY    . HERNIA REPAIR      Family History  Problem Relation Age of Onset  . Kidney disease Mother   . Cancer Father        stomach cancer    Social History   Socioeconomic History  . Marital status: Divorced    Spouse name: Not on file  . Number of children: 1  . Years of education: Not on file  . Highest education level: Master's degree (e.g., MA, MS, MEng, MEd, MSW, MBA)  Occupational History    Employer: [employer]  Social Needs  . Financial resource strain: Not hard at all  . Food insecurity:    Worry: Never true    Inability: Never true  . Transportation needs:    Medical: No    Non-medical: No  Tobacco Use  . Smoking status: Never Smoker  . Smokeless  tobacco: Never Used  . Tobacco comment: smoking cessation materials not required  Substance and Sexual Activity  . Alcohol use: No    Alcohol/week: 0.0 oz  . Drug use: No  . Sexual activity: Never  Lifestyle  . Physical activity:    Days per week: 0 days    Minutes per session: 0 min  . Stress: Not at all  Relationships  . Social connections:    Talks on phone:  Patient refused    Gets together: Patient refused    Attends religious service: Patient refused    Active member of club or organization: Patient refused    Attends meetings of clubs or organizations: Patient refused    Relationship status: Divorced  . Intimate partner violence:    Fear of current or ex partner: No    Emotionally abused: No    Physically abused: No    Forced sexual activity: No  Other Topics Concern  . Not on file  Social History Narrative  . Not on file    No Known Allergies  Outpatient Medications Prior to Visit  Medication Sig Dispense Refill  . amLODipine (NORVASC) 5 MG tablet TAKE 1 TABLET(5 MG) BY MOUTH DAILY 30 tablet 11  . CRESTOR 10 MG tablet Take 1 tablet (10 mg total) by mouth daily. 30 tablet 11  . dutasteride (AVODART) 0.5 MG capsule TAKE 1 CAPSULE(0.5 MG) BY MOUTH DAILY 30 capsule 11  . Vitamin D, Ergocalciferol, (DRISDOL) 50000 units CAPS capsule Take 1 capsule (50,000 Units total) by mouth every 30 (thirty) days. 30 capsule 11   No facility-administered medications prior to visit.     Review of Systems  Constitutional: Negative for chills, fever and malaise/fatigue.  HENT: Negative for ear discharge and ear pain.   Eyes: Negative for blurred vision.  Respiratory: Negative for sputum production and shortness of breath.   Cardiovascular: Negative for palpitations, orthopnea, leg swelling and PND.  Gastrointestinal: Negative for blood in stool, constipation and diarrhea.  Genitourinary: Negative for dysuria, frequency, hematuria, hesitancy, incomplete emptying and urgency.  Musculoskeletal: Negative for back pain, joint pain, myalgias and neck pain.  Skin: Negative for rash.  Neurological: Negative for dizziness, tingling, sensory change, focal weakness and headaches.  Endo/Heme/Allergies: Negative for environmental allergies and polydipsia. Does not bruise/bleed easily.  Psychiatric/Behavioral: Negative for depression and suicidal ideas. The  patient is not nervous/anxious and does not have insomnia.      Objective  Vitals:   10/18/17 1416  BP: 130/80  Pulse: 72  Weight: 198 lb (89.8 kg)  Height: 5\' 9"  (1.753 m)    Physical Exam  Constitutional: He is oriented to person, place, and time.  HENT:  Head: Normocephalic.  Right Ear: External ear normal.  Left Ear: External ear normal.  Nose: Nose normal.  Mouth/Throat: Oropharynx is clear and moist.  Eyes: Pupils are equal, round, and reactive to light. Conjunctivae and EOM are normal. Right eye exhibits no discharge. Left eye exhibits no discharge. No scleral icterus.  Neck: Normal range of motion. Neck supple. No JVD present. No tracheal deviation present. No thyromegaly present.  Cardiovascular: Normal rate, regular rhythm, S1 normal, S2 normal, normal heart sounds and intact distal pulses. Exam reveals no gallop, no S3, no distant heart sounds and no friction rub.  No murmur heard.  No systolic murmur is present.  No diastolic murmur is present. Pulmonary/Chest: Breath sounds normal. No respiratory distress. He has no wheezes. He has no rales.  Abdominal: Soft. Bowel sounds are normal.  He exhibits no mass. There is no hepatosplenomegaly. There is no tenderness. There is no rebound, no guarding and no CVA tenderness.  Musculoskeletal: Normal range of motion. He exhibits no edema or tenderness.  Lymphadenopathy:    He has no cervical adenopathy.  Neurological: He is alert and oriented to person, place, and time. He has normal strength and normal reflexes. No cranial nerve deficit.  Skin: Skin is warm. No rash noted.  Nursing note and vitals reviewed.     Assessment & Plan  Problem List Items Addressed This Visit      Cardiovascular and Mediastinum   Essential hypertension - Primary   Relevant Medications   amLODipine (NORVASC) 5 MG tablet   CRESTOR 10 MG tablet   Other Relevant Orders   Renal Function Panel     Musculoskeletal and Integument    Hyperlipidemia type III   Relevant Medications   CRESTOR 10 MG tablet   Other Relevant Orders   Lipid panel     Other   Vitamin D deficiency   Relevant Medications   Vitamin D, Ergocalciferol, (DRISDOL) 50000 units CAPS capsule    Other Visit Diagnoses    Benign prostatic hyperplasia with weak urinary stream       check psa level   Relevant Medications   dutasteride (AVODART) 0.5 MG capsule   Other Relevant Orders   PSA   CKD (chronic kidney disease) stage 3, GFR 30-59 ml/min (HCC)       Guaiac positive stools       send to Dr. Gustavo Lah for further eval/cbc   Relevant Orders   CBC with Differential/Platelet   POCT Occult Blood Stool (Completed)   Ambulatory referral to Gastroenterology      Meds ordered this encounter  Medications  . amLODipine (NORVASC) 5 MG tablet    Sig: TAKE 1 TABLET(5 MG) BY MOUTH DAILY    Dispense:  90 tablet    Refill:  2    sched appt  . CRESTOR 10 MG tablet    Sig: Take 1 tablet (10 mg total) by mouth daily.    Dispense:  90 tablet    Refill:  2  . Vitamin D, Ergocalciferol, (DRISDOL) 50000 units CAPS capsule    Sig: Take 1 capsule (50,000 Units total) by mouth every 30 (thirty) days.    Dispense:  30 capsule    Refill:  11  . dutasteride (AVODART) 0.5 MG capsule    Sig: TAKE 1 CAPSULE(0.5 MG) BY MOUTH DAILY    Dispense:  90 capsule    Refill:  2    Hasn't been seen in a year- sched appt for "med refills"      Dr. [facility]  10/18/17

## 2017-10-19 LAB — LIPID PANEL
CHOL/HDL RATIO: 4 ratio (ref 0.0–5.0)
CHOLESTEROL TOTAL: 202 mg/dL — AB (ref 100–199)
HDL: 50 mg/dL (ref >39)
LDL CALC: 130 mg/dL — AB (ref 0–99)
TRIGLYCERIDES: 112 mg/dL (ref 0–149)
VLDL CHOLESTEROL CAL: 22 mg/dL (ref 5–40)

## 2017-10-19 LAB — RENAL FUNCTION PANEL
Albumin: 4.3 g/dL (ref 3.5–4.8)
BUN/Creatinine Ratio: 13 (ref 10–24)
BUN: 19 mg/dL (ref 8–27)
CO2: 22 mmol/L (ref 20–29)
Calcium: 9.4 mg/dL (ref 8.6–10.2)
Chloride: 105 mmol/L (ref 96–106)
Creatinine, Ser: 1.41 mg/dL — ABNORMAL HIGH (ref 0.76–1.27)
GFR calc Af Amer: 54 mL/min/{1.73_m2} — ABNORMAL LOW (ref >59)
GFR calc non Af Amer: 47 mL/min/{1.73_m2} — ABNORMAL LOW (ref >59)
Glucose: 94 mg/dL (ref 65–99)
Phosphorus: 3.2 mg/dL (ref 2.5–4.5)
Potassium: 5 mmol/L (ref 3.5–5.2)
Sodium: 141 mmol/L (ref 134–144)

## 2017-10-19 LAB — CBC WITH DIFFERENTIAL/PLATELET
Basophils Absolute: 0 10*3/uL (ref 0.0–0.2)
Basos: 0 %
EOS (ABSOLUTE): 0 10*3/uL (ref 0.0–0.4)
Eos: 0 %
Hematocrit: 44.8 % (ref 37.5–51.0)
Hemoglobin: 14.9 g/dL (ref 13.0–17.7)
Immature Grans (Abs): 0 10*3/uL (ref 0.0–0.1)
Immature Granulocytes: 0 %
Lymphocytes Absolute: 2.5 10*3/uL (ref 0.7–3.1)
Lymphs: 50 %
MCH: 32.9 pg (ref 26.6–33.0)
MCHC: 33.3 g/dL (ref 31.5–35.7)
MCV: 99 fL — ABNORMAL HIGH (ref 79–97)
Monocytes Absolute: 0.5 10*3/uL (ref 0.1–0.9)
Monocytes: 11 %
Neutrophils Absolute: 2 10*3/uL (ref 1.4–7.0)
Neutrophils: 39 %
Platelets: 156 10*3/uL (ref 150–450)
RBC: 4.53 x10E6/uL (ref 4.14–5.80)
RDW: 13.3 % (ref 12.3–15.4)
WBC: 5 10*3/uL (ref 3.4–10.8)

## 2017-10-19 LAB — PSA: Prostate Specific Ag, Serum: 1.9 ng/mL (ref 0.0–4.0)

## 2018-06-30 ENCOUNTER — Other Ambulatory Visit: Payer: Self-pay | Admitting: Family Medicine

## 2018-06-30 DIAGNOSIS — I10 Essential (primary) hypertension: Secondary | ICD-10-CM

## 2018-07-09 ENCOUNTER — Ambulatory Visit: Payer: Medicare Other | Admitting: Family Medicine

## 2018-10-02 ENCOUNTER — Ambulatory Visit: Payer: Medicare Other

## 2018-10-23 ENCOUNTER — Other Ambulatory Visit: Payer: Self-pay | Admitting: Family Medicine

## 2018-10-23 DIAGNOSIS — N401 Enlarged prostate with lower urinary tract symptoms: Secondary | ICD-10-CM

## 2018-10-23 DIAGNOSIS — E782 Mixed hyperlipidemia: Secondary | ICD-10-CM

## 2019-01-01 ENCOUNTER — Other Ambulatory Visit: Payer: Self-pay

## 2019-01-01 ENCOUNTER — Ambulatory Visit (INDEPENDENT_AMBULATORY_CARE_PROVIDER_SITE_OTHER): Payer: Medicare Other

## 2019-01-01 ENCOUNTER — Encounter: Payer: Self-pay | Admitting: Family Medicine

## 2019-01-01 ENCOUNTER — Ambulatory Visit: Payer: Medicare Other | Admitting: Family Medicine

## 2019-01-01 VITALS — BP 144/80 | HR 64 | Ht 69.0 in | Wt 194.0 lb

## 2019-01-01 VITALS — BP 144/80 | Temp 97.0°F | Ht 69.0 in | Wt 194.0 lb

## 2019-01-01 DIAGNOSIS — Z0000 Encounter for general adult medical examination without abnormal findings: Secondary | ICD-10-CM | POA: Diagnosis not present

## 2019-01-01 DIAGNOSIS — N401 Enlarged prostate with lower urinary tract symptoms: Secondary | ICD-10-CM

## 2019-01-01 DIAGNOSIS — I10 Essential (primary) hypertension: Secondary | ICD-10-CM | POA: Diagnosis not present

## 2019-01-01 DIAGNOSIS — E782 Mixed hyperlipidemia: Secondary | ICD-10-CM | POA: Diagnosis not present

## 2019-01-01 DIAGNOSIS — R3912 Poor urinary stream: Secondary | ICD-10-CM

## 2019-01-01 DIAGNOSIS — E559 Vitamin D deficiency, unspecified: Secondary | ICD-10-CM | POA: Diagnosis not present

## 2019-01-01 MED ORDER — AMLODIPINE BESYLATE 5 MG PO TABS: ORAL_TABLET | ORAL | 1 refills | Status: DC

## 2019-01-01 MED ORDER — VITAMIN D (ERGOCALCIFEROL) 1.25 MG (50000 UNIT) PO CAPS: 50000.0000 [IU] | ORAL_CAPSULE | ORAL | 1 refills | Status: DC

## 2019-01-01 MED ORDER — VITAMIN D (ERGOCALCIFEROL) 1.25 MG (50000 UNIT) PO CAPS: 50000.0000 [IU] | ORAL_CAPSULE | ORAL | 3 refills | Status: DC

## 2019-01-01 MED ORDER — CRESTOR 10 MG PO TABS: 10.0000 mg | ORAL_TABLET | Freq: Every day | ORAL | 1 refills | Status: DC

## 2019-01-01 MED ORDER — DUTASTERIDE 0.5 MG PO CAPS: ORAL_CAPSULE | ORAL | 1 refills | Status: DC

## 2019-01-01 NOTE — Progress Notes (Signed)
Date:  01/01/2019   Name:  Victor Walsh   DOB:  23-Jan-1936   MRN:  [id number]   Chief Complaint: Hypertension and Hyperlipidemia  Hypertension This is a chronic problem. The current episode started more than 1 year ago. The problem has been waxing and waning since onset. The problem is uncontrolled. Pertinent negatives include no anxiety, blurred vision, chest pain, headaches, malaise/fatigue, neck pain, orthopnea, palpitations, peripheral edema, PND, shortness of breath or sweats. There are no associated agents to hypertension. Past treatments include calcium channel blockers. The current treatment provides moderate improvement. There are no compliance problems.  There is no history of angina, kidney disease, CAD/MI, CVA, heart failure, left ventricular hypertrophy, PVD or retinopathy. There is no history of chronic renal disease, a hypertension causing med or renovascular disease.  Hyperlipidemia This is a chronic problem. The current episode started more than 1 year ago. The problem is controlled. Recent lipid tests were reviewed and are normal. He has no history of chronic renal disease, diabetes, hypothyroidism, liver disease, obesity or nephrotic syndrome. Pertinent negatives include no chest pain, focal sensory loss, focal weakness, leg pain, myalgias or shortness of breath. Current antihyperlipidemic treatment includes statins. The current treatment provides moderate improvement of lipids.  Benign Prostatic Hypertrophy This is a chronic problem. The current episode started more than 1 year ago. The problem has been waxing and waning since onset. Irritative symptoms do not include frequency or urgency. Obstructive symptoms do not include dribbling, incomplete emptying, an intermittent stream, a slower stream or a weak stream. ?overflow circumstance. Pertinent negatives include no chills, dysuria, hematuria or nausea. He is sexually active. Past treatments include dutasteride.    Review of  Systems  Constitutional: Negative for chills, fever and malaise/fatigue.  HENT: Negative for drooling, ear discharge, ear pain and sore throat.   Eyes: Negative for blurred vision.  Respiratory: Negative for cough, shortness of breath and wheezing.   Cardiovascular: Negative for chest pain, palpitations, orthopnea, leg swelling and PND.  Gastrointestinal: Negative for abdominal pain, blood in stool, constipation, diarrhea and nausea.  Endocrine: Negative for polydipsia.  Genitourinary: Negative for dysuria, frequency, hematuria, incomplete emptying and urgency.  Musculoskeletal: Negative for back pain, myalgias and neck pain.  Skin: Negative for rash.  Allergic/Immunologic: Negative for environmental allergies.  Neurological: Negative for dizziness, focal weakness and headaches.  Hematological: Does not bruise/bleed easily.  Psychiatric/Behavioral: Negative for suicidal ideas. The patient is not nervous/anxious.     Patient Active Problem List   Diagnosis Date Noted  . Essential hypertension 02/09/2015  . Hyperlipidemia type III 02/09/2015  . Benign prostatic hypertrophy 02/09/2015  . Vitamin D deficiency 02/09/2015    No Known Allergies  Past Surgical History:  Procedure Laterality Date  . COLONOSCOPY  2013   cleared- Dr Gustavo Lah  . COLONOSCOPY N/A 08/22/2016   Procedure: COLONOSCOPY;  Surgeon: Lollie Sails, MD;  Location: [facility] ENDOSCOPY;  Service: Endoscopy;  Laterality: N/A;  . COLONOSCOPY W/ POLYPECTOMY    . HERNIA REPAIR      Social History   Tobacco Use  . Smoking status: Never Smoker  . Smokeless tobacco: Never Used  . Tobacco comment: smoking cessation materials not required  Substance Use Topics  . Alcohol use: No    Alcohol/week: 0.0 standard drinks  . Drug use: No     Medication list has been reviewed and updated.  No outpatient medications have been marked as taking for the 01/01/19 encounter (Office Visit) with Juline Patch, MD.  PHQ 2/9  Scores 01/01/2019 10/18/2017 09/26/2017 10/23/2016  PHQ - 2 Score 0 0 0 0  PHQ- 9 Score 0 0 0 -    BP Readings from Last 3 Encounters:  01/01/19 (!) 144/80  10/18/17 130/80  09/26/17 120/70    Physical Exam Vitals signs and nursing note reviewed.  HENT:     Head: Normocephalic.     Right Ear: Tympanic membrane and external ear normal.     Left Ear: Tympanic membrane and external ear normal.     Nose: Nose normal.  Eyes:     General: No scleral icterus.       Right eye: No discharge.        Left eye: No discharge.     Conjunctiva/sclera: Conjunctivae normal.     Pupils: Pupils are equal, round, and reactive to light.  Neck:     Musculoskeletal: Normal range of motion and neck supple.     Thyroid: No thyromegaly.     Vascular: No JVD.     Trachea: No tracheal deviation.  Cardiovascular:     Rate and Rhythm: Normal rate and regular rhythm.     Heart sounds: Normal heart sounds, S1 normal and S2 normal. No murmur. No systolic murmur. No friction rub. No gallop. No S3 or S4 sounds.   Pulmonary:     Effort: No respiratory distress.     Breath sounds: Normal breath sounds. No wheezing or rales.  Abdominal:     General: Bowel sounds are normal.     Palpations: Abdomen is soft. There is no mass.     Tenderness: There is no abdominal tenderness. There is no guarding or rebound.  Genitourinary:    Prostate: Enlarged. Not tender and no nodules present.     Rectum: Normal. Guaiac result negative. No mass or tenderness.  Musculoskeletal: Normal range of motion.        General: No tenderness.     Right lower leg: No edema.     Left lower leg: No edema.  Lymphadenopathy:     Cervical: No cervical adenopathy.  Skin:    General: Skin is warm.     Findings: No rash.  Neurological:     Mental Status: He is alert and oriented to person, place, and time.     Cranial Nerves: No cranial nerve deficit.     Deep Tendon Reflexes: Reflexes are normal and symmetric.     Wt Readings from Last 3  Encounters:  01/01/19 194 lb (88 kg)  10/18/17 198 lb (89.8 kg)  09/26/17 191 lb 3.2 oz (86.7 kg)    BP (!) 144/80   Pulse 64   Ht 5\' 9"  (1.753 m)   Wt 194 lb (88 kg)   BMI 28.65 kg/m   Assessment and Plan: 1. Benign prostatic hyperplasia with weak urinary stream Chronic.  Controlled.  Mild enlargement of the prostate on DRE.  Will refill Avodart 0.5 once a day.  Will recheck PSA. - dutasteride (AVODART) 0.5 MG capsule; TAKE ONE CAPSULE BY MOUTH DAILY  Dispense: 90 capsule; Refill: 1 - PSA  2. Essential hypertension Chronic.  Controlled.  Continue amlodipine 5 mg once a day and recheck renal function panel. - amLODipine (NORVASC) 5 MG tablet; TAKE 1 TABLET BY MOUTH DAILY  Dispense: 90 tablet; Refill: 1 - Renal Function Panel  3. Hyperlipidemia type III Patient with history of hyperlipidemia 3 which is chronic.  Controlled.  Will check lipid panel and continue Crestor 10 mg once a day. -  CRESTOR 10 MG tablet; Take 1 tablet (10 mg total) by mouth daily.  Dispense: 90 tablet; Refill: 1 - Lipid panel  4. Vitamin D deficiency Patient with history of vitamin D will continue vitamin D supplementation on on 30-day basis - Vitamin D, Ergocalciferol, (DRISDOL) 1.25 MG (50000 UT) CAPS capsule; Take 1 capsule (50,000 Units total) by mouth every 30 (thirty) days.  Dispense: 3 capsule; Refill: 3

## 2019-01-01 NOTE — Progress Notes (Signed)
Subjective:   Victor Walsh is a 83 y.o. male who presents for Medicare Annual/Subsequent preventive examination.  Virtual Visit via Telephone Note  I connected with Victor Walsh on 01/01/19 at  3:20 PM EDT by telephone and verified that I am speaking with the correct person using two identifiers.  Medicare Annual Wellness visit completed telephonically due to Covid-19 pandemic.   Location: Patient: home Provider: office   I discussed the limitations, risks, security and privacy concerns of performing an evaluation and management service by telephone and the availability of in person appointments. The patient expressed understanding and agreed to proceed.  Some vital signs may be absent or patient reported.   [name], LPN    Review of Systems:   Cardiac Risk Factors include: advanced age (>24men, >45 women);dyslipidemia;hypertension;male gender     Objective:    Vitals: BP (!) 144/80   Temp (!) 97 F (36.1 C)   Ht 5\' 9"  (1.753 m)   Wt 194 lb (88 kg)   BMI 28.65 kg/m   Body mass index is 28.65 kg/m.  Advanced Directives 01/01/2019 09/26/2017 08/22/2016 02/09/2015  Does Patient Have a Medical Advance Directive? No No No Yes  Type of Advance Directive - - - Living will;Healthcare Power of [place] in Chart? - - - No - copy requested  Would patient like information on creating a medical advance directive? No - Patient declined Yes (MAU/Ambulatory/Procedural Areas - Information given) - -    Tobacco Social History   Tobacco Use  Smoking Status Never Smoker  Smokeless Tobacco Never Used  Tobacco Comment   smoking cessation materials not required     Counseling given: Not Answered Comment: smoking cessation materials not required   Clinical Intake:  Pre-visit preparation completed: Yes  Pain : No/denies pain     BMI - recorded: 28.65 Nutritional Status: BMI 25 -29 Overweight Nutritional Risks: None Diabetes: No  How  often do you need to have someone help you when you read instructions, pamphlets, or other written materials from your doctor or pharmacy?: 1 - Never  Interpreter Needed?: No  Information entered by :: [name] LPN  Past Medical History:  Diagnosis Date  . BPH (benign prostatic hyperplasia)   . Chronic kidney disease   . Hyperlipidemia   . Hypertension   . Stomach ulcer    Past Surgical History:  Procedure Laterality Date  . COLONOSCOPY  2013   cleared- Dr Gustavo Lah  . COLONOSCOPY N/A 08/22/2016   Procedure: COLONOSCOPY;  Surgeon: Lollie Sails, MD;  Location: [facility] ENDOSCOPY;  Service: Endoscopy;  Laterality: N/A;  . COLONOSCOPY W/ POLYPECTOMY    . HERNIA REPAIR     Family History  Problem Relation Age of Onset  . Kidney disease Mother   . Cancer Father        stomach cancer   Social History   Socioeconomic History  . Marital status: Divorced    Spouse name: Not on file  . Number of children: 1  . Years of education: Not on file  . Highest education level: Master's degree (e.g., MA, MS, MEng, MEd, MSW, MBA)  Occupational History    Employer: [employer]  Social Needs  . Financial resource strain: Not hard at all  . Food insecurity    Worry: Never true    Inability: Never true  . Transportation needs    Medical: No    Non-medical: No  Tobacco Use  .  Smoking status: Never Smoker  . Smokeless tobacco: Never Used  . Tobacco comment: smoking cessation materials not required  Substance and Sexual Activity  . Alcohol use: No    Alcohol/week: 0.0 standard drinks  . Drug use: No  . Sexual activity: Never  Lifestyle  . Physical activity    Days per week: 0 days    Minutes per session: 0 min  . Stress: Not at all  Relationships  . Social Herbalist on phone: Patient refused    Gets together: Patient refused    Attends religious service: Patient refused    Active member of club or organization: Patient refused    Attends meetings of  clubs or organizations: Patient refused    Relationship status: Divorced  Other Topics Concern  . Not on file  Social History Narrative  . Not on file    Outpatient Encounter Medications as of 01/01/2019  Medication Sig  . amLODipine (NORVASC) 5 MG tablet TAKE 1 TABLET BY MOUTH DAILY  . aspirin EC 81 MG tablet Take by mouth.  . CRESTOR 10 MG tablet Take 1 tablet (10 mg total) by mouth daily.  Marland Kitchen dutasteride (AVODART) 0.5 MG capsule TAKE ONE CAPSULE BY MOUTH DAILY  . Vitamin D, Ergocalciferol, (DRISDOL) 1.25 MG (50000 UT) CAPS capsule Take 1 capsule (50,000 Units total) by mouth every 30 (thirty) days.  . [DISCONTINUED] Vitamin D, Ergocalciferol, (DRISDOL) 1.25 MG (50000 UT) CAPS capsule Take 1 capsule (50,000 Units total) by mouth every 30 (thirty) days.   No facility-administered encounter medications on file as of 01/01/2019.     Activities of Daily Living In your present state of health, do you have any difficulty performing the following activities: 01/01/2019  Hearing? N  Comment declines hearing aids  Vision? N  Difficulty concentrating or making decisions? N  Walking or climbing stairs? N  Dressing or bathing? N  Doing errands, shopping? N  Preparing Food and eating ? N  Using the Toilet? N  In the past six months, have you accidently leaked urine? N  Do you have problems with loss of bowel control? N  Managing your Medications? N  Managing your Finances? N  Housekeeping or managing your Housekeeping? N  Some recent data might be hidden    Patient Care Team: Juline Patch, MD as PCP - General (Family Medicine) Lollie Sails, MD as Consulting Physician (Gastroenterology)   Assessment:   This is a routine wellness examination for Victor Walsh.  Exercise Activities and Dietary recommendations Current Exercise Habits: The patient does not participate in regular exercise at present, Exercise limited by: None identified  Goals    . DIET - INCREASE WATER INTAKE      Recommend to drink at least 6-8 8oz glasses of water per day.       Fall Risk Fall Risk  09/26/2017 10/23/2016 09/14/2015  Falls in the past year? No Yes No  Number falls in past yr: - 1 -  Injury with Fall? - No -  Risk for fall due to : Impaired vision - -  Risk for fall due to: Comment wears eyeglasses - -  Follow up - Falls evaluation completed -   FALL RISK PREVENTION PERTAINING TO THE HOME:  Any stairs in or around the home? Yes  If so, do they handrails? Yes   Home free of loose throw rugs in walkways, pet beds, electrical cords, etc? Yes  Adequate lighting in your home to reduce risk of falls?  Yes   ASSISTIVE DEVICES UTILIZED TO PREVENT FALLS:  Life alert? No  Use of a cane, walker or w/c? No  Grab bars in the bathroom? No  Shower chair or bench in shower? No  Elevated toilet seat or a handicapped toilet? No   DME ORDERS:  DME order needed?  No   TIMED UP AND GO:  Was the test performed? No . Telephonic visit.   Education: Fall risk prevention has been discussed.  Intervention(s) required? No   Depression Screen PHQ 2/9 Scores 01/01/2019 10/18/2017 09/26/2017 10/23/2016  PHQ - 2 Score 0 0 0 0  PHQ- 9 Score 0 0 0 -    Cognitive Function     6CIT Screen 09/26/2017  What Year? 0 points  What month? 0 points  What time? 0 points  Count back from 20 0 points  Months in reverse 0 points  Repeat phrase 2 points  Total Score 2    Immunization History  Administered Date(s) Administered  . Influenza Split 02/09/2015  . Influenza, High Dose Seasonal PF 02/21/2017, 02/04/2018  . Influenza,inj,Quad PF,6+ Mos 02/09/2015  . Pneumococcal Conjugate-13 02/09/2015  . Pneumococcal Polysaccharide-23 05/22/2006  . Tdap 09/14/2015    Qualifies for Shingles Vaccine? Yes  . Due for Shingrix. Education has been provided regarding the importance of this vaccine. Pt has been advised to call insurance company to determine out of pocket expense. Advised may also receive vaccine at  local pharmacy or Health Dept. Verbalized acceptance and understanding.  Tdap: Up to date  Flu Vaccine: Up to date  Pneumococcal Vaccine: Up to date   Screening Tests Health Maintenance  Topic Date Due  . INFLUENZA VACCINE  12/21/2018  . TETANUS/TDAP  09/13/2025  . PNA vac Low Risk Adult  Completed   Cancer Screenings:  Colorectal Screening: Completed 08/22/16.  No longer required.   Lung Cancer Screening: (Low Dose CT Chest recommended if Age 86-80 years, 30 pack-year currently smoking OR have quit w/in 15years.) does not qualify.      Additional Screening:  Hepatitis C Screening: no longer required  Vision Screening: Recommended annual ophthalmology exams for early detection of glaucoma and other disorders of the eye. Is the patient up to date with their annual eye exam?  No  Who is the provider or what is the name of the office in which the pt attends annual eye exams? [facility] [place]  Dental Screening: Recommended annual dental exams for proper oral hygiene  Community Resource Referral:  CRR required this visit?  No       Plan:    I have personally reviewed and addressed the Medicare Annual Wellness questionnaire and have noted the following in the patient's chart:  A. Medical and social history B. Use of alcohol, tobacco or illicit drugs  C. Current medications and supplements D. Functional ability and status E.  Nutritional status F.  Physical activity G. Advance directives H. List of other physicians I.  Hospitalizations, surgeries, and ER visits in previous 12 months J.  [place] such as hearing and vision if needed, cognitive and depression L. Referrals and appointments   In addition, I have reviewed and discussed with patient certain preventive protocols, quality metrics, and best practice recommendations. A written personalized care plan for preventive services as well as general preventive health recommendations were provided to patient.    Signed,  [name], LPN Nurse Health Advisor   Nurse Notes: none

## 2019-01-01 NOTE — Addendum Note (Signed)
Addended by: Fredderick Severance on: 01/01/2019 08:37 AM   Modules accepted: Orders

## 2019-01-01 NOTE — Patient Instructions (Signed)
Victor Walsh , Thank you for taking time to come for your Medicare Wellness Visit. I appreciate your ongoing commitment to your health goals. Please review the following plan we discussed and let me know if I can assist you in the future.   Screening recommendations/referrals: Colonoscopy: done 08/22/16 Recommended yearly ophthalmology/optometry visit for glaucoma screening and checkup Recommended yearly dental visit for hygiene and checkup  Vaccinations: Influenza vaccine: done 02/04/18 Pneumococcal vaccine: done 02/09/15 Tdap vaccine: done 09/14/15 Shingles vaccine: Shingrix discussed. Please contact your pharmacy for coverage information.   Advanced directives: Advance directive discussed with you today. Even though you declined this today please call our office should you change your mind and we can give you the proper paperwork for you to fill out.  Conditions/risks identified: recommend increasing physical activity   Next appointment: Please follow up in one year for your Medicare Annual Wellness visit.    Preventive Care 81 Years and Older, Male Preventive care refers to lifestyle choices and visits with your health care provider that can promote health and wellness. What does preventive care include?  A yearly physical exam. This is also called an annual well check.  Dental exams once or twice a year.  Routine eye exams. Ask your health care provider how often you should have your eyes checked.  Personal lifestyle choices, including:  Daily care of your teeth and gums.  Regular physical activity.  Eating a healthy diet.  Avoiding tobacco and drug use.  Limiting alcohol use.  Practicing safe sex.  Taking low doses of aspirin every day.  Taking vitamin and mineral supplements as recommended by your health care provider. What happens during an annual well check? The services and screenings done by your health care provider during your annual well check will depend on  your age, overall health, lifestyle risk factors, and family history of disease. Counseling  Your health care provider may ask you questions about your:  Alcohol use.  Tobacco use.  Drug use.  Emotional well-being.  Home and relationship well-being.  Sexual activity.  Eating habits.  History of falls.  Memory and ability to understand (cognition).  Work and work Statistician. Screening  You may have the following tests or measurements:  Height, weight, and BMI.  Blood pressure.  Lipid and cholesterol levels. These may be checked every 5 years, or more frequently if you are over 19 years old.  Skin check.  Lung cancer screening. You may have this screening every year starting at age 65 if you have a 30-pack-year history of smoking and currently smoke or have quit within the past 15 years.  Fecal occult blood test (FOBT) of the stool. You may have this test every year starting at age 78.  Flexible sigmoidoscopy or colonoscopy. You may have a sigmoidoscopy every 5 years or a colonoscopy every 10 years starting at age 76.  Prostate cancer screening. Recommendations will vary depending on your family history and other risks.  Hepatitis C blood test.  Hepatitis B blood test.  Sexually transmitted disease (STD) testing.  Diabetes screening. This is done by checking your blood sugar (glucose) after you have not eaten for a while (fasting). You may have this done every 1-3 years.  Abdominal aortic aneurysm (AAA) screening. You may need this if you are a current or former smoker.  Osteoporosis. You may be screened starting at age 11 if you are at high risk. Talk with your health care provider about your test results, treatment options, and if necessary,  the need for more tests. Vaccines  Your health care provider may recommend certain vaccines, such as:  Influenza vaccine. This is recommended every year.  Tetanus, diphtheria, and acellular pertussis (Tdap, Td) vaccine.  You may need a Td booster every 10 years.  Zoster vaccine. You may need this after age 33.  Pneumococcal 13-valent conjugate (PCV13) vaccine. One dose is recommended after age 27.  Pneumococcal polysaccharide (PPSV23) vaccine. One dose is recommended after age 82. Talk to your health care provider about which screenings and vaccines you need and how often you need them. This information is not intended to replace advice given to you by your health care provider. Make sure you discuss any questions you have with your health care provider. Document Released: 06/04/2015 Document Revised: 01/26/2016 Document Reviewed: 03/09/2015 Elsevier Interactive Patient Education  2017 [place] Prevention in the Home Falls can cause injuries. They can happen to people of all ages. There are many things you can do to make your home safe and to help prevent falls. What can I do on the outside of my home?  Regularly fix the edges of walkways and driveways and fix any cracks.  Remove anything that might make you trip as you walk through a door, such as a raised step or threshold.  Trim any bushes or trees on the path to your home.  Use bright outdoor lighting.  Clear any walking paths of anything that might make someone trip, such as rocks or tools.  Regularly check to see if handrails are loose or broken. Make sure that both sides of any steps have handrails.  Any raised decks and porches should have guardrails on the edges.  Have any leaves, snow, or ice cleared regularly.  Use sand or salt on walking paths during winter.  Clean up any spills in your garage right away. This includes oil or grease spills. What can I do in the bathroom?  Use night lights.  Install grab bars by the toilet and in the tub and shower. Do not use towel bars as grab bars.  Use non-skid mats or decals in the tub or shower.  If you need to sit down in the shower, use a plastic, non-slip stool.  Keep the  floor dry. Clean up any water that spills on the floor as soon as it happens.  Remove soap buildup in the tub or shower regularly.  Attach bath mats securely with double-sided non-slip rug tape.  Do not have throw rugs and other things on the floor that can make you trip. What can I do in the bedroom?  Use night lights.  Make sure that you have a light by your bed that is easy to reach.  Do not use any sheets or blankets that are too big for your bed. They should not hang down onto the floor.  Have a firm chair that has side arms. You can use this for support while you get dressed.  Do not have throw rugs and other things on the floor that can make you trip. What can I do in the kitchen?  Clean up any spills right away.  Avoid walking on wet floors.  Keep items that you use a lot in easy-to-reach places.  If you need to reach something above you, use a strong step stool that has a grab bar.  Keep electrical cords out of the way.  Do not use floor polish or wax that makes floors slippery. If you must use  wax, use non-skid floor wax.  Do not have throw rugs and other things on the floor that can make you trip. What can I do with my stairs?  Do not leave any items on the stairs.  Make sure that there are handrails on both sides of the stairs and use them. Fix handrails that are broken or loose. Make sure that handrails are as long as the stairways.  Check any carpeting to make sure that it is firmly attached to the stairs. Fix any carpet that is loose or worn.  Avoid having throw rugs at the top or bottom of the stairs. If you do have throw rugs, attach them to the floor with carpet tape.  Make sure that you have a light switch at the top of the stairs and the bottom of the stairs. If you do not have them, ask someone to add them for you. What else can I do to help prevent falls?  Wear shoes that:  Do not have high heels.  Have rubber bottoms.  Are comfortable and fit  you well.  Are closed at the toe. Do not wear sandals.  If you use a stepladder:  Make sure that it is fully opened. Do not climb a closed stepladder.  Make sure that both sides of the stepladder are locked into place.  Ask someone to hold it for you, if possible.  Clearly mark and make sure that you can see:  Any grab bars or handrails.  First and last steps.  Where the edge of each step is.  Use tools that help you move around (mobility aids) if they are needed. These include:  Canes.  Walkers.  Scooters.  Crutches.  Turn on the lights when you go into a dark area. Replace any light bulbs as soon as they burn out.  Set up your furniture so you have a clear path. Avoid moving your furniture around.  If any of your floors are uneven, fix them.  If there are any pets around you, be aware of where they are.  Review your medicines with your doctor. Some medicines can make you feel dizzy. This can increase your chance of falling. Ask your doctor what other things that you can do to help prevent falls. This information is not intended to replace advice given to you by your health care provider. Make sure you discuss any questions you have with your health care provider. Document Released: 03/04/2009 Document Revised: 10/14/2015 Document Reviewed: 06/12/2014 Elsevier Interactive Patient Education  2017 [employer].

## 2019-01-02 LAB — PSA: Prostate Specific Ag, Serum: 2 ng/mL (ref 0.0–4.0)

## 2019-01-02 LAB — RENAL FUNCTION PANEL
Albumin: 4.4 g/dL (ref 3.7–4.7)
BUN/Creatinine Ratio: 14 (ref 10–24)
BUN: 19 mg/dL (ref 8–27)
CO2: 25 mmol/L (ref 20–29)
Calcium: 9.7 mg/dL (ref 8.6–10.2)
Chloride: 103 mmol/L (ref 96–106)
Creatinine, Ser: 1.38 mg/dL — ABNORMAL HIGH (ref 0.76–1.27)
GFR calc Af Amer: 55 mL/min/{1.73_m2} — ABNORMAL LOW (ref >59)
GFR calc non Af Amer: 48 mL/min/{1.73_m2} — ABNORMAL LOW (ref >59)
Glucose: 104 mg/dL — ABNORMAL HIGH (ref 65–99)
Phosphorus: 2.7 mg/dL — ABNORMAL LOW (ref 2.8–4.1)
Potassium: 4.8 mmol/L (ref 3.5–5.2)
Sodium: 143 mmol/L (ref 134–144)

## 2019-01-02 LAB — LIPID PANEL
Chol/HDL Ratio: 4.2 ratio (ref 0.0–5.0)
Cholesterol, Total: 216 mg/dL — ABNORMAL HIGH (ref 100–199)
HDL: 52 mg/dL (ref >39)
LDL Calculated: 135 mg/dL — ABNORMAL HIGH (ref 0–99)
Triglycerides: 146 mg/dL (ref 0–149)
VLDL Cholesterol Cal: 29 mg/dL (ref 5–40)

## 2019-01-29 DIAGNOSIS — N183 Chronic kidney disease, stage 3 unspecified: Secondary | ICD-10-CM | POA: Insufficient documentation

## 2019-07-02 ENCOUNTER — Ambulatory Visit (INDEPENDENT_AMBULATORY_CARE_PROVIDER_SITE_OTHER): Payer: Medicare PPO | Admitting: Family Medicine

## 2019-07-02 ENCOUNTER — Encounter: Payer: Self-pay | Admitting: Family Medicine

## 2019-07-02 ENCOUNTER — Other Ambulatory Visit: Payer: Self-pay

## 2019-07-02 VITALS — BP 126/78 | HR 80 | Ht 69.0 in | Wt 194.0 lb

## 2019-07-02 DIAGNOSIS — E559 Vitamin D deficiency, unspecified: Secondary | ICD-10-CM | POA: Diagnosis not present

## 2019-07-02 DIAGNOSIS — E782 Mixed hyperlipidemia: Secondary | ICD-10-CM | POA: Diagnosis not present

## 2019-07-02 DIAGNOSIS — I10 Essential (primary) hypertension: Secondary | ICD-10-CM | POA: Diagnosis not present

## 2019-07-02 DIAGNOSIS — N401 Enlarged prostate with lower urinary tract symptoms: Secondary | ICD-10-CM | POA: Diagnosis not present

## 2019-07-02 DIAGNOSIS — R3912 Poor urinary stream: Secondary | ICD-10-CM | POA: Diagnosis not present

## 2019-07-02 MED ORDER — AMLODIPINE BESYLATE 5 MG PO TABS: ORAL_TABLET | ORAL | 1 refills | Status: DC

## 2019-07-02 MED ORDER — DUTASTERIDE 0.5 MG PO CAPS: ORAL_CAPSULE | ORAL | 1 refills | Status: DC

## 2019-07-02 MED ORDER — VITAMIN D (ERGOCALCIFEROL) 1.25 MG (50000 UNIT) PO CAPS: 50000.0000 [IU] | ORAL_CAPSULE | ORAL | 1 refills | Status: DC

## 2019-07-02 MED ORDER — CRESTOR 10 MG PO TABS: 10.0000 mg | ORAL_TABLET | Freq: Every day | ORAL | 1 refills | Status: DC

## 2019-07-02 NOTE — Progress Notes (Signed)
Date:  07/02/2019   Name:  Victor Walsh   DOB:  [birth year]   MRN:  [id number]   Chief Complaint: Hypertension, Hyperlipidemia, vitamin d def, and Benign Prostatic Hypertrophy  Hypertension This is a chronic problem. The current episode started more than 1 year ago. The problem has been gradually improving since onset. The problem is controlled. Pertinent negatives include no anxiety, blurred vision, chest pain, headaches, malaise/fatigue, neck pain, orthopnea, palpitations, peripheral edema, PND, shortness of breath or sweats. There are no associated agents to hypertension. Risk factors for coronary artery disease include dyslipidemia, obesity and post-menopausal state. Past treatments include calcium channel blockers. The current treatment provides moderate improvement. There are no compliance problems.  There is no history of angina, kidney disease, CAD/MI, CVA, heart failure, left ventricular hypertrophy, PVD or retinopathy. There is no history of chronic renal disease, a hypertension causing med or renovascular disease.  Hyperlipidemia This is a chronic problem. The current episode started more than 1 year ago. The problem is controlled. Recent lipid tests were reviewed and are normal. He has no history of chronic renal disease, diabetes, hypothyroidism, liver disease, obesity or nephrotic syndrome. There are no known factors aggravating his hyperlipidemia. Pertinent negatives include no chest pain, focal sensory loss, focal weakness, leg pain, myalgias or shortness of breath. Current antihyperlipidemic treatment includes statins. The current treatment provides moderate improvement of lipids. There are no compliance problems.   Benign Prostatic Hypertrophy This is a chronic problem. The current episode started more than 1 year ago. The problem has been gradually improving since onset. Irritative symptoms do not include frequency, nocturia or urgency. Obstructive symptoms do not include dribbling,  incomplete emptying, an intermittent stream, a slower stream, straining or a weak stream. Pertinent negatives include no chills, dysuria, genital pain, hematuria, hesitancy, nausea or vomiting. Past treatments include dutasteride.    Lab Results  Component Value Date   CREATININE 1.38 (H) 01/01/2019   BUN 19 01/01/2019   NA 143 01/01/2019   K 4.8 01/01/2019   CL 103 01/01/2019   CO2 25 01/01/2019   Lab Results  Component Value Date   CHOL 216 (H) 01/01/2019   HDL 52 01/01/2019   LDLCALC 135 (H) 01/01/2019   TRIG 146 01/01/2019   CHOLHDL 4.2 01/01/2019   No results found for: TSH No results found for: HGBA1C   Review of Systems  Constitutional: Negative for chills, fever and malaise/fatigue.  HENT: Negative for drooling, ear discharge, ear pain, postnasal drip and sore throat.   Eyes: Negative for blurred vision.  Respiratory: Negative for cough, shortness of breath and wheezing.   Cardiovascular: Negative for chest pain, palpitations, orthopnea, leg swelling and PND.  Gastrointestinal: Negative for abdominal pain, blood in stool, constipation, diarrhea, nausea and vomiting.  Endocrine: Negative for polydipsia, polyphagia and polyuria.  Genitourinary: Negative for difficulty urinating, dysuria, frequency, hematuria, hesitancy, incomplete emptying, nocturia and urgency.  Musculoskeletal: Negative for back pain, myalgias and neck pain.  Skin: Negative for rash.  Allergic/Immunologic: Negative for environmental allergies.  Neurological: Negative for dizziness, focal weakness and headaches.  Hematological: Does not bruise/bleed easily.  Psychiatric/Behavioral: Negative for suicidal ideas. The patient is not nervous/anxious.     Patient Active Problem List   Diagnosis Date Noted  . Chronic kidney disease (CKD), stage III (moderate) 01/29/2019  . Essential hypertension 02/09/2015  . Hyperlipidemia type III 02/09/2015  . Benign prostatic hypertrophy 02/09/2015  . Vitamin D  deficiency 02/09/2015    No Known Allergies  Past  Surgical History:  Procedure Laterality Date  . COLONOSCOPY  2013   cleared- Dr Gustavo Lah  . COLONOSCOPY N/A 08/22/2016   Procedure: COLONOSCOPY;  Surgeon: Lollie Sails, MD;  Location: [facility] ENDOSCOPY;  Service: Endoscopy;  Laterality: N/A;  . COLONOSCOPY W/ POLYPECTOMY    . HERNIA REPAIR      Social History   Tobacco Use  . Smoking status: Never Smoker  . Smokeless tobacco: Never Used  . Tobacco comment: smoking cessation materials not required  Substance Use Topics  . Alcohol use: No    Alcohol/week: 0.0 standard drinks  . Drug use: No     Medication list has been reviewed and updated.  Current Meds  Medication Sig  . amLODipine (NORVASC) 5 MG tablet TAKE 1 TABLET BY MOUTH DAILY  . aspirin EC 81 MG tablet Take by mouth.  . CRESTOR 10 MG tablet Take 1 tablet (10 mg total) by mouth daily.  Marland Kitchen dutasteride (AVODART) 0.5 MG capsule TAKE ONE CAPSULE BY MOUTH DAILY  . Vitamin D, Ergocalciferol, (DRISDOL) 1.25 MG (50000 UT) CAPS capsule Take 1 capsule (50,000 Units total) by mouth every 30 (thirty) days.    PHQ 2/9 Scores 07/02/2019 01/01/2019 10/18/2017 09/26/2017  PHQ - 2 Score 0 0 0 0  PHQ- 9 Score 0 0 0 0    BP Readings from Last 3 Encounters:  07/02/19 126/78  01/01/19 (!) 144/80  01/01/19 (!) 144/80    Physical Exam Vitals and nursing note reviewed.  HENT:     Head: Normocephalic.     Right Ear: Tympanic membrane, ear canal and external ear normal.     Left Ear: Tympanic membrane, ear canal and external ear normal.     Nose: Nose normal.     Mouth/Throat:     Lips: Pink.     Mouth: Mucous membranes are moist.     Dentition: Normal dentition.     Tongue: No lesions.     Palate: No mass and lesions.     Pharynx: Oropharynx is clear. Uvula midline.  Eyes:     General: No scleral icterus.       Right eye: No discharge.        Left eye: No discharge.     Conjunctiva/sclera: Conjunctivae normal.     Pupils:  Pupils are equal, round, and reactive to light.  Neck:     Thyroid: No thyromegaly.     Vascular: No JVD.     Trachea: No tracheal deviation.  Cardiovascular:     Rate and Rhythm: Normal rate and regular rhythm.     Pulses: Normal pulses.     Heart sounds: Normal heart sounds, S1 normal and S2 normal. No murmur. No systolic murmur. No diastolic murmur. No friction rub. No gallop. No S3 or S4 sounds.   Pulmonary:     Effort: No respiratory distress.     Breath sounds: Normal breath sounds. No wheezing or rales.  Abdominal:     General: Bowel sounds are normal.     Palpations: Abdomen is soft. There is no mass.     Tenderness: There is no abdominal tenderness. There is no guarding or rebound.  Genitourinary:    Prostate: Normal. Not enlarged, not tender and no nodules present.     Rectum: Normal. Guaiac result negative. No mass.  Musculoskeletal:        General: No tenderness. Normal range of motion.     Cervical back: Normal range of motion and neck supple.  Right lower leg: No edema.     Left lower leg: No edema.  Lymphadenopathy:     Cervical: No cervical adenopathy.  Skin:    General: Skin is warm.     Findings: No rash.  Neurological:     Mental Status: He is alert and oriented to person, place, and time.     Cranial Nerves: No cranial nerve deficit.     Deep Tendon Reflexes: Reflexes are normal and symmetric.     Wt Readings from Last 3 Encounters:  07/02/19 194 lb (88 kg)  01/01/19 194 lb (88 kg)  01/01/19 194 lb (88 kg)    BP 126/78   Pulse 80   Ht 5\' 9"  (1.753 m)   Wt 194 lb (88 kg)   BMI 28.65 kg/m   Assessment and Plan: 1. Essential hypertension Chronic.  Controlled.  Stable.  Continue amlodipine 5 mg once a day.  Will check CMP to assess GFR. - Comprehensive Metabolic Panel (CMET) - amLODipine (NORVASC) 5 MG tablet; TAKE 1 TABLET BY MOUTH DAILY  Dispense: 90 tablet; Refill: 1  2. Benign prostatic hyperplasia with weak urinary stream Chronic.   Controlled.  Stable.  DRE was normal.  Continue Avodart 0.5 mg daily. - dutasteride (AVODART) 0.5 MG capsule; TAKE ONE CAPSULE BY MOUTH DAILY  Dispense: 90 capsule; Refill: 1  3. Hyperlipidemia type III Chronic.  Controlled.  Stable.  Continue Crestor 10 mg once a day.  Will check lipid panel with ratio. - Lipid Panel With LDL/HDL Ratio - CRESTOR 10 MG tablet; Take 1 tablet (10 mg total) by mouth daily.  Dispense: 90 tablet; Refill: 1  4. Vitamin D deficiency Patient has chronic issues with vitamin D deficiency for which he takes the 50,000 unit capsule every 30 days. - Vitamin D, Ergocalciferol, (DRISDOL) 1.25 MG (50000 UNIT) CAPS capsule; Take 1 capsule (50,000 Units total) by mouth every 30 (thirty) days.  Dispense: 3 capsule; Refill: 1

## 2019-07-03 ENCOUNTER — Telehealth: Payer: Self-pay

## 2019-07-03 LAB — COMPREHENSIVE METABOLIC PANEL
ALT: 19 IU/L (ref 0–44)
AST: 19 IU/L (ref 0–40)
Albumin/Globulin Ratio: 1.7 (ref 1.2–2.2)
Albumin: 4.2 g/dL (ref 3.6–4.6)
Alkaline Phosphatase: 54 IU/L (ref 39–117)
BUN/Creatinine Ratio: 14 (ref 10–24)
BUN: 21 mg/dL (ref 8–27)
Bilirubin Total: 0.4 mg/dL (ref 0.0–1.2)
CO2: 23 mmol/L (ref 20–29)
Calcium: 9.7 mg/dL (ref 8.6–10.2)
Chloride: 104 mmol/L (ref 96–106)
Creatinine, Ser: 1.48 mg/dL — ABNORMAL HIGH (ref 0.76–1.27)
GFR calc Af Amer: 51 mL/min/{1.73_m2} — ABNORMAL LOW (ref >59)
GFR calc non Af Amer: 44 mL/min/{1.73_m2} — ABNORMAL LOW (ref >59)
Globulin, Total: 2.5 g/dL (ref 1.5–4.5)
Glucose: 121 mg/dL — ABNORMAL HIGH (ref 65–99)
Potassium: 5 mmol/L (ref 3.5–5.2)
Sodium: 142 mmol/L (ref 134–144)
Total Protein: 6.7 g/dL (ref 6.0–8.5)

## 2019-07-03 LAB — LIPID PANEL WITH LDL/HDL RATIO
Cholesterol, Total: 176 mg/dL (ref 100–199)
HDL: 51 mg/dL (ref >39)
LDL Chol Calc (NIH): 107 mg/dL — ABNORMAL HIGH (ref 0–99)
LDL/HDL Ratio: 2.1 ratio (ref 0.0–3.6)
Triglycerides: 97 mg/dL (ref 0–149)
VLDL Cholesterol Cal: 18 mg/dL (ref 5–40)

## 2019-07-03 NOTE — Telephone Encounter (Signed)
Called pt concerning lab results/ acceptable, but there has been a decrease in kidney function. Encouraged to push fluids and recheck in 3 months- had to leave message on machine

## 2019-07-04 ENCOUNTER — Ambulatory Visit: Payer: Medicare Other | Admitting: Family Medicine

## 2019-11-06 ENCOUNTER — Telehealth: Payer: Self-pay

## 2019-11-06 ENCOUNTER — Other Ambulatory Visit: Payer: Self-pay

## 2019-11-06 DIAGNOSIS — E782 Mixed hyperlipidemia: Secondary | ICD-10-CM

## 2019-11-06 MED ORDER — ROSUVASTATIN CALCIUM 10 MG PO TABS: 10.0000 mg | ORAL_TABLET | Freq: Every day | ORAL | 1 refills | Status: DC

## 2019-11-06 NOTE — Telephone Encounter (Signed)
Sent in Rosuvastatin to [facility]

## 2019-11-06 NOTE — Telephone Encounter (Signed)
Called and left message for pt- insurance will not pay for Brand Crestor- they will pay for anything else generic, including the rosuvastatin. Waiting on pt's call back to explain

## 2019-11-06 NOTE — Telephone Encounter (Signed)
Called pt about insurance and medication issues. Pt agreed to take rosuvastatin. Wants it sent to [facility].  KP

## 2019-11-06 NOTE — Telephone Encounter (Signed)
Pt returned Tara's call/ Please advise

## 2019-12-31 ENCOUNTER — Telehealth: Payer: Self-pay | Admitting: Family Medicine

## 2019-12-31 NOTE — Telephone Encounter (Signed)
LM for pt to call back and reschedule AWV due to [facility] being out of office

## 2020-01-05 ENCOUNTER — Ambulatory Visit: Payer: Medicare Other

## 2020-01-14 ENCOUNTER — Ambulatory Visit (INDEPENDENT_AMBULATORY_CARE_PROVIDER_SITE_OTHER): Payer: Medicare PPO

## 2020-01-14 DIAGNOSIS — Z0000 Encounter for general adult medical examination without abnormal findings: Secondary | ICD-10-CM

## 2020-01-14 NOTE — Progress Notes (Signed)
Subjective:   Victor Walsh is a 84 y.o. male who presents for Medicare Annual/Subsequent preventive examination.  Virtual Visit via Telephone Note  I connected with  Victor Walsh on 01/14/20 at  2:40 PM EDT by telephone and verified that I am speaking with the correct person using two identifiers.  Medicare Annual Wellness visit completed telephonically due to Covid-19 pandemic.   Location: Patient: home Provider: office   I discussed the limitations, risks, security and privacy concerns of performing an evaluation and management service by telephone and the availability of in person appointments. The patient expressed understanding and agreed to proceed.  Unable to perform video visit due to video visit attempted and failed and/or patient does not have video capability.   Some vital signs may be absent or patient reported.   Victor Marker, LPN    Review of Systems     Cardiac Risk Factors include: advanced age (>50men, >37 women);dyslipidemia;hypertension;male gender     Objective:    There were no vitals filed for this visit. There is no height or weight on file to calculate BMI.  Advanced Directives 01/14/2020 01/01/2019 09/26/2017 08/22/2016 02/09/2015  Does Patient Have a Medical Advance Directive? No No No No Yes  Type of Advance Directive - - - - Living will;Healthcare Power of [place] in Chart? - - - - No - copy requested  Would patient like information on creating a medical advance directive? No - Patient declined No - Patient declined Yes (MAU/Ambulatory/Procedural Areas - Information given) - -    Current Medications (verified) Outpatient Encounter Medications as of 01/14/2020  Medication Sig  . amLODipine (NORVASC) 5 MG tablet TAKE 1 TABLET BY MOUTH DAILY  . aspirin EC 81 MG tablet Take by mouth.  . dutasteride (AVODART) 0.5 MG capsule TAKE ONE CAPSULE BY MOUTH DAILY  . rosuvastatin (CRESTOR) 10 MG tablet Take 1 tablet (10 mg  total) by mouth daily.  . Vitamin D, Ergocalciferol, (DRISDOL) 1.25 MG (50000 UNIT) CAPS capsule Take 1 capsule (50,000 Units total) by mouth every 30 (thirty) days.   No facility-administered encounter medications on file as of 01/14/2020.    Allergies (verified) Patient has no known allergies.   History: Past Medical History:  Diagnosis Date  . BPH (benign prostatic hyperplasia)   . Chronic kidney disease   . Hyperlipidemia   . Hypertension   . Stomach ulcer    Past Surgical History:  Procedure Laterality Date  . COLONOSCOPY  2013   cleared- Dr Gustavo Lah  . COLONOSCOPY N/A 08/22/2016   Procedure: COLONOSCOPY;  Surgeon: Lollie Sails, MD;  Location: [facility] ENDOSCOPY;  Service: Endoscopy;  Laterality: N/A;  . COLONOSCOPY W/ POLYPECTOMY    . HERNIA REPAIR     Family History  Problem Relation Age of Onset  . Kidney disease Mother   . Cancer Father        stomach cancer   Social History   Socioeconomic History  . Marital status: Divorced    Spouse name: Not on file  . Number of children: 1  . Years of education: Not on file  . Highest education level: Master's degree (e.g., MA, MS, MEng, MEd, MSW, MBA)  Occupational History    Employer: [employer]  Tobacco Use  . Smoking status: Never Smoker  . Smokeless tobacco: Never Used  . Tobacco comment: smoking cessation materials not required  Vaping Use  . Vaping Use: Never used  Substance and Sexual  Activity  . Alcohol use: No    Alcohol/week: 0.0 standard drinks  . Drug use: No  . Sexual activity: Never  Other Topics Concern  . Not on file  Social History Narrative  . Not on file   Social Determinants of Health   Financial Resource Strain: Low Risk   . Difficulty of Paying Living Expenses: Not hard at all  Food Insecurity: No Food Insecurity  . Worried About Charity fundraiser in the Last Year: Never true  . Ran Out of Food in the Last Year: Never true  Transportation Needs: No Transportation Needs   . Lack of Transportation (Medical): No  . Lack of Transportation (Non-Medical): No  Physical Activity: Insufficiently Active  . Days of Exercise per Week: 1 day  . Minutes of Exercise per Session: 30 min  Stress: No Stress Concern Present  . Feeling of Stress : Not at all  Social Connections: Unknown  . Frequency of Communication with Friends and Family: Patient refused  . Frequency of Social Gatherings with Friends and Family: Patient refused  . Attends Religious Services: Patient refused  . Active Member of Clubs or Organizations: Patient refused  . Attends Archivist Meetings: Patient refused  . Marital Status: Divorced    Tobacco Counseling Counseling given: Not Answered Comment: smoking cessation materials not required   Clinical Intake:  Pre-visit preparation completed: Yes  Pain : No/denies pain     Nutritional Risks: None Diabetes: No  How often do you need to have someone help you when you read instructions, pamphlets, or other written materials from your doctor or pharmacy?: 1 - Never   Interpreter Needed?: No  Information entered by :: Victor Marker LPN   Activities of Daily Living In your present state of health, do you have any difficulty performing the following activities: 01/14/2020  Hearing? N  Comment declines hearing aids  Vision? N  Difficulty concentrating or making decisions? N  Walking or climbing stairs? N  Dressing or bathing? N  Doing errands, shopping? N  Preparing Food and eating ? N  Using the Toilet? N  In the past six months, have you accidently leaked urine? N  Do you have problems with loss of bowel control? N  Managing your Medications? N  Managing your Finances? N  Housekeeping or managing your Housekeeping? N  Some recent data might be hidden    Patient Care Team: Juline Patch, MD as PCP - General (Family Medicine) Lollie Sails, MD (Inactive) as Consulting Physician (Gastroenterology)  Indicate any  recent Medical Services you may have received from other than [facility] providers in the past year (date may be approximate).     Assessment:   This is a routine wellness examination for Victor Walsh.  Hearing/Vision screen  Hearing Screening   125Hz  250Hz  500Hz  1000Hz  2000Hz  3000Hz  4000Hz  6000Hz  8000Hz   Right ear:           Left ear:           Comments: Pt denies hearing difficulty  Vision Screening Comments: [facility] screenings done at [facility]. Does not go annually  Dietary issues and exercise activities discussed: Current Exercise Habits: Home exercise routine, Type of exercise: walking, Time (Minutes): 30, Frequency (Times/Week): 1, Weekly Exercise (Minutes/Week): 30, Intensity: Mild, Exercise limited by: None identified  Goals    . DIET - INCREASE WATER INTAKE     Recommend to drink at least 6-8 8oz glasses of water per day.  Depression Screen PHQ 2/9 Scores 01/14/2020 07/02/2019 01/01/2019 10/18/2017 09/26/2017 10/23/2016 09/14/2015  PHQ - 2 Score 0 0 0 0 0 0 0  PHQ- 9 Score - 0 0 0 0 - -    Fall Risk Fall Risk  01/14/2020 07/02/2019 09/26/2017 10/23/2016 09/14/2015  Falls in the past year? 0 0 No Yes No  Number falls in past yr: 0 - - 1 -  Injury with Fall? 0 - - No -  Risk for fall due to : No Fall Risks - Impaired vision - -  Risk for fall due to: Comment - - wears eyeglasses - -  Follow up Falls prevention discussed Falls evaluation completed - Falls evaluation completed -    Any stairs in or around the home? Yes  If so, are there any without handrails? No  Home free of loose throw rugs in walkways, pet beds, electrical cords, etc? Yes  Adequate lighting in your home to reduce risk of falls? Yes   ASSISTIVE DEVICES UTILIZED TO PREVENT FALLS:  Life alert? No  Use of a cane, walker or w/c? No  Grab bars in the bathroom? Yes  Shower chair or bench in shower? No  Elevated toilet seat or a handicapped toilet? No   TIMED UP AND GO:  Was the test performed? No . Telephonic  visit.  Cognitive Function:     6CIT Screen 01/14/2020 01/01/2019 09/26/2017  What Year? 0 points 0 points 0 points  What month? 0 points 0 points 0 points  What time? 0 points 0 points 0 points  Count back from 20 0 points 0 points 0 points  Months in reverse 0 points 0 points 0 points  Repeat phrase 0 points 0 points 2 points  Total Score 0 0 2    Immunizations Immunization History  Administered Date(s) Administered  . Influenza Split 02/09/2015  . Influenza, High Dose Seasonal PF 02/21/2017, 02/04/2018  . Influenza,inj,Quad PF,6+ Mos 02/09/2015  . Influenza-Unspecified 01/21/2019  . PFIZER SARS-COV-2 Vaccination 06/26/2019, 07/17/2019  . Pneumococcal Conjugate-13 02/09/2015  . Pneumococcal Polysaccharide-23 05/22/2006  . Tdap 09/14/2015    TDAP status: Up to date   Flu Vaccine status: Up to date   Pneumococcal vaccine status: Up to date   Covid-19 vaccine status: Completed vaccines  Qualifies for Shingles Vaccine? Yes   Zostavax completed No   Shingrix Completed?: No.    Education has been provided regarding the importance of this vaccine. Patient has been advised to call insurance company to determine out of pocket expense if they have not yet received this vaccine. Advised may also receive vaccine at local pharmacy or Health Dept. Verbalized acceptance and understanding.  Screening Tests Health Maintenance  Topic Date Due  . INFLUENZA VACCINE  12/21/2019  . TETANUS/TDAP  09/13/2025  . COVID-19 Vaccine  Completed  . PNA vac Low Risk Adult  Completed    Health Maintenance  Health Maintenance Due  Topic Date Due  . INFLUENZA VACCINE  12/21/2019    Colorectal cancer screening: No longer required.   Lung Cancer Screening: (Low Dose CT Chest recommended if Age 73-80 years, 30 pack-year currently smoking OR have quit w/in 15years.) does not qualify.   Additional Screening:  Hepatitis C Screening: No longer required  Vision Screening: Recommended annual  ophthalmology exams for early detection of glaucoma and other disorders of the eye. Is the patient up to date with their annual eye exam?  No  Who is the provider or what is the name of the  office in which the patient attends annual eye exams? [facility] [place]  Dental Screening: Recommended annual dental exams for proper oral hygiene  Community Resource Referral / Chronic Care Management: CRR required this visit?  No   CCM required this visit?  No      Plan:     I have personally reviewed and noted the following in the patient's chart:   . Medical and social history . Use of alcohol, tobacco or illicit drugs  . Current medications and supplements . Functional ability and status . Nutritional status . Physical activity . Advanced directives . List of other physicians . Hospitalizations, surgeries, and ER visits in previous 12 months . Vitals . Screenings to include cognitive, depression, and falls . Referrals and appointments  In addition, I have reviewed and discussed with patient certain preventive protocols, quality metrics, and best practice recommendations. A written personalized care plan for preventive services as well as general preventive health recommendations were provided to patient.      Victor Marker, LPN   07/17/3333   Nurse Notes: advised patient due for appointment and labs to check kidney function. Patient does not have a future appt scheduled.

## 2020-01-14 NOTE — Patient Instructions (Signed)
[name] , Thank you for taking time to come for your Medicare Wellness Visit. I appreciate your ongoing commitment to your health goals. Please review the following plan we discussed and let me know if I can assist you in the future.   Screening recommendations/referrals: Colonoscopy: no longer required Recommended yearly ophthalmology/optometry visit for glaucoma screening and checkup Recommended yearly dental visit for hygiene and checkup  Vaccinations: Influenza vaccine: done 01/21/19  Pneumococcal vaccine: done 02/09/15 Tdap vaccine: done 09/14/15 Shingles vaccine: Shingrix discussed. Please contact your pharmacy for coverage information.  Covid-19: done 06/26/19 & 07/17/19  Advanced directives: Please bring a copy of your health care power of attorney and living will to the office at your convenience.  Conditions/risks identified: Recommend increasing physical activity   Next appointment: Follow up in one year for your annual wellness visit.   Preventive Care 39 Years and Older, Male Preventive care refers to lifestyle choices and visits with your health care provider that can promote health and wellness. What does preventive care include?  A yearly physical exam. This is also called an annual well check.  Dental exams once or twice a year.  Routine eye exams. Ask your health care provider how often you should have your eyes checked.  Personal lifestyle choices, including:  Daily care of your teeth and gums.  Regular physical activity.  Eating a healthy diet.  Avoiding tobacco and drug use.  Limiting alcohol use.  Practicing safe sex.  Taking low doses of aspirin every day.  Taking vitamin and mineral supplements as recommended by your health care provider. What happens during an annual well check? The services and screenings done by your health care provider during your annual well check will depend on your age, overall health, lifestyle risk factors, and family  history of disease. Counseling  Your health care provider may ask you questions about your:  Alcohol use.  Tobacco use.  Drug use.  Emotional well-being.  Home and relationship well-being.  Sexual activity.  Eating habits.  History of falls.  Memory and ability to understand (cognition).  Work and work Statistician. Screening  You may have the following tests or measurements:  Height, weight, and BMI.  Blood pressure.  Lipid and cholesterol levels. These may be checked every 5 years, or more frequently if you are over 8 years old.  Skin check.  Lung cancer screening. You may have this screening every year starting at age 2 if you have a 30-pack-year history of smoking and currently smoke or have quit within the past 15 years.  Fecal occult blood test (FOBT) of the stool. You may have this test every year starting at age 33.  Flexible sigmoidoscopy or colonoscopy. You may have a sigmoidoscopy every 5 years or a colonoscopy every 10 years starting at age 20.  Prostate cancer screening. Recommendations will vary depending on your family history and other risks.  Hepatitis C blood test.  Hepatitis B blood test.  Sexually transmitted disease (STD) testing.  Diabetes screening. This is done by checking your blood sugar (glucose) after you have not eaten for a while (fasting). You may have this done every 1-3 years.  Abdominal aortic aneurysm (AAA) screening. You may need this if you are a current or former smoker.  Osteoporosis. You may be screened starting at age 57 if you are at high risk. Talk with your health care provider about your test results, treatment options, and if necessary, the need for more tests. Vaccines  Your health care  provider may recommend certain vaccines, such as:  Influenza vaccine. This is recommended every year.  Tetanus, diphtheria, and acellular pertussis (Tdap, Td) vaccine. You may need a Td booster every 10 years.  Zoster vaccine.  You may need this after age 77.  Pneumococcal 13-valent conjugate (PCV13) vaccine. One dose is recommended after age 46.  Pneumococcal polysaccharide (PPSV23) vaccine. One dose is recommended after age 21. Talk to your health care provider about which screenings and vaccines you need and how often you need them. This information is not intended to replace advice given to you by your health care provider. Make sure you discuss any questions you have with your health care provider. Document Released: 06/04/2015 Document Revised: 01/26/2016 Document Reviewed: 03/09/2015 Elsevier Interactive Patient Education  2017 [place] Prevention in the Home Falls can cause injuries. They can happen to people of all ages. There are many things you can do to make your home safe and to help prevent falls. What can I do on the outside of my home?  Regularly fix the edges of walkways and driveways and fix any cracks.  Remove anything that might make you trip as you walk through a door, such as a raised step or threshold.  Trim any bushes or trees on the path to your home.  Use bright outdoor lighting.  Clear any walking paths of anything that might make someone trip, such as rocks or tools.  Regularly check to see if handrails are loose or broken. Make sure that both sides of any steps have handrails.  Any raised decks and porches should have guardrails on the edges.  Have any leaves, snow, or ice cleared regularly.  Use sand or salt on walking paths during winter.  Clean up any spills in your garage right away. This includes oil or grease spills. What can I do in the bathroom?  Use night lights.  Install grab bars by the toilet and in the tub and shower. Do not use towel bars as grab bars.  Use non-skid mats or decals in the tub or shower.  If you need to sit down in the shower, use a plastic, non-slip stool.  Keep the floor dry. Clean up any water that spills on the floor as soon  as it happens.  Remove soap buildup in the tub or shower regularly.  Attach bath mats securely with double-sided non-slip rug tape.  Do not have throw rugs and other things on the floor that can make you trip. What can I do in the bedroom?  Use night lights.  Make sure that you have a light by your bed that is easy to reach.  Do not use any sheets or blankets that are too big for your bed. They should not hang down onto the floor.  Have a firm chair that has side arms. You can use this for support while you get dressed.  Do not have throw rugs and other things on the floor that can make you trip. What can I do in the kitchen?  Clean up any spills right away.  Avoid walking on wet floors.  Keep items that you use a lot in easy-to-reach places.  If you need to reach something above you, use a strong step stool that has a grab bar.  Keep electrical cords out of the way.  Do not use floor polish or wax that makes floors slippery. If you must use wax, use non-skid floor wax.  Do not have throw  rugs and other things on the floor that can make you trip. What can I do with my stairs?  Do not leave any items on the stairs.  Make sure that there are handrails on both sides of the stairs and use them. Fix handrails that are broken or loose. Make sure that handrails are as long as the stairways.  Check any carpeting to make sure that it is firmly attached to the stairs. Fix any carpet that is loose or worn.  Avoid having throw rugs at the top or bottom of the stairs. If you do have throw rugs, attach them to the floor with carpet tape.  Make sure that you have a light switch at the top of the stairs and the bottom of the stairs. If you do not have them, ask someone to add them for you. What else can I do to help prevent falls?  Wear shoes that:  Do not have high heels.  Have rubber bottoms.  Are comfortable and fit you well.  Are closed at the toe. Do not wear sandals.  If  you use a stepladder:  Make sure that it is fully opened. Do not climb a closed stepladder.  Make sure that both sides of the stepladder are locked into place.  Ask someone to hold it for you, if possible.  Clearly mark and make sure that you can see:  Any grab bars or handrails.  First and last steps.  Where the edge of each step is.  Use tools that help you move around (mobility aids) if they are needed. These include:  Canes.  Walkers.  Scooters.  Crutches.  Turn on the lights when you go into a dark area. Replace any light bulbs as soon as they burn out.  Set up your furniture so you have a clear path. Avoid moving your furniture around.  If any of your floors are uneven, fix them.  If there are any pets around you, be aware of where they are.  Review your medicines with your doctor. Some medicines can make you feel dizzy. This can increase your chance of falling. Ask your doctor what other things that you can do to help prevent falls. This information is not intended to replace advice given to you by your health care provider. Make sure you discuss any questions you have with your health care provider. Document Released: 03/04/2009 Document Revised: 10/14/2015 Document Reviewed: 06/12/2014 Elsevier Interactive Patient Education  2017 [employer].

## 2020-02-14 ENCOUNTER — Other Ambulatory Visit: Payer: Self-pay | Admitting: Family Medicine

## 2020-02-14 DIAGNOSIS — I10 Essential (primary) hypertension: Secondary | ICD-10-CM

## 2020-02-14 NOTE — Telephone Encounter (Signed)
Requested Prescriptions  Pending Prescriptions Disp Refills   amLODipine (NORVASC) 5 MG tablet [Pharmacy Med Name: AMLODIPINE BESYLATE 5MG  TABLETS] 30 tablet 0    Sig: TAKE 1 TABLET BY MOUTH DAILY     Cardiovascular:  Calcium Channel Blockers Failed - 02/14/2020  2:54 PM      Failed - Valid encounter within last 6 months    Recent Outpatient Visits          7 months ago Essential hypertension   [place], Deanna C, MD   1 year ago Essential hypertension   [facility] Juline Patch, MD   2 years ago Essential hypertension   [place], Deanna C, MD   3 years ago Hyperlipidemia type III   [facility] Juline Patch, MD   4 years ago Medicare annual wellness visit, subsequent   [facility] Juline Patch, MD             Passed - Last BP in normal range    BP Readings from Last 1 Encounters:  07/02/19 126/78         One month courtesy refill with reminder for patient to schedule an appointment for follow-up.

## 2020-02-17 ENCOUNTER — Other Ambulatory Visit: Payer: Self-pay | Admitting: Family Medicine

## 2020-02-17 DIAGNOSIS — E559 Vitamin D deficiency, unspecified: Secondary | ICD-10-CM

## 2020-02-17 NOTE — Telephone Encounter (Signed)
Requested medication (s) are due for refill today: yes  Requested medication (s) are on the active medication list: yes  Last refill:  07/02/19  Future visit scheduled: no  Notes to clinic:  med not delegated to NT to RF   Requested Prescriptions  Pending Prescriptions Disp Refills   Vitamin D, Ergocalciferol, (DRISDOL) 1.25 MG (50000 UNIT) CAPS capsule [Pharmacy Med Name: VITAMIN D2 50,000IU (ERGO) CAP RX] 3 capsule 1    Sig: TAKE 1 CAPSULE BY MOUTH EVERY 30 DAYS      Endocrinology:  Vitamins - Vitamin D Supplementation Failed - 02/17/2020 12:52 PM      Failed - 50,000 IU strengths are not delegated      Failed - Phosphate in normal range and within 360 days    Phosphorus  Date Value Ref Range Status  01/01/2019 2.7 (L) 2.8 - 4.1 mg/dL Final          Failed - Vitamin D in normal range and within 360 days    No results found for: [id number], [id number], [id number], 25OHVITD3, 25OHVITD2, 25OHVITD3, 25OHVITD2, 25OHVITD1, 25OHVITD2, 25OHVITD3, VD25OH        Passed - Ca in normal range and within 360 days    Calcium  Date Value Ref Range Status  07/02/2019 9.7 8.6 - 10.2 mg/dL Final   Calcium, Total  Date Value Ref Range Status  06/03/2012 8.9 8.5 - 10.1 mg/dL Final          Passed - Valid encounter within last 12 months    Recent Outpatient Visits           7 months ago Essential hypertension   [facility] Juline Patch, MD   1 year ago Essential hypertension   [facility] Juline Patch, MD   2 years ago Essential hypertension   [facility] Juline Patch, MD   3 years ago Hyperlipidemia type III   [facility] Juline Patch, MD   4 years ago Medicare annual wellness visit, subsequent   [facility] Juline Patch, MD

## 2020-02-18 ENCOUNTER — Telehealth: Payer: Self-pay | Admitting: Family Medicine

## 2020-02-18 NOTE — Telephone Encounter (Signed)
Pt has been trying to get meds refilled, said he was just seen in February and had never had to be seen that often in order to get medications. Works for a [employer] home and will be going out of town so he is unable to come in and get his medications. Wants provider that he tried to get refills but was unable.

## 2020-02-19 NOTE — Telephone Encounter (Signed)
Please call him and tell him he is suppose to be seen every 6 months, needs appt. But it was also something to do with his insurance for the med

## 2020-04-09 ENCOUNTER — Other Ambulatory Visit: Payer: Self-pay | Admitting: Family Medicine

## 2020-04-09 ENCOUNTER — Ambulatory Visit: Payer: Medicare PPO | Admitting: Family Medicine

## 2020-04-09 DIAGNOSIS — E782 Mixed hyperlipidemia: Secondary | ICD-10-CM

## 2020-04-14 ENCOUNTER — Ambulatory Visit: Payer: Medicare PPO | Admitting: Family Medicine

## 2020-04-14 ENCOUNTER — Encounter: Payer: Self-pay | Admitting: Family Medicine

## 2020-04-14 ENCOUNTER — Other Ambulatory Visit: Payer: Self-pay

## 2020-04-14 VITALS — BP 144/94 | HR 84 | Ht 69.0 in | Wt 194.0 lb

## 2020-04-14 DIAGNOSIS — I10 Essential (primary) hypertension: Secondary | ICD-10-CM | POA: Diagnosis not present

## 2020-04-14 DIAGNOSIS — N401 Enlarged prostate with lower urinary tract symptoms: Secondary | ICD-10-CM

## 2020-04-14 DIAGNOSIS — E782 Mixed hyperlipidemia: Secondary | ICD-10-CM

## 2020-04-14 DIAGNOSIS — R3912 Poor urinary stream: Secondary | ICD-10-CM

## 2020-04-14 DIAGNOSIS — E559 Vitamin D deficiency, unspecified: Secondary | ICD-10-CM

## 2020-04-14 MED ORDER — VITAMIN D (ERGOCALCIFEROL) 1.25 MG (50000 UNIT) PO CAPS: ORAL_CAPSULE | ORAL | 1 refills | Status: DC

## 2020-04-14 MED ORDER — AMLODIPINE BESYLATE 5 MG PO TABS: 5.0000 mg | ORAL_TABLET | Freq: Every day | ORAL | 1 refills | Status: DC

## 2020-04-14 MED ORDER — ATORVASTATIN CALCIUM 10 MG PO TABS: 10.0000 mg | ORAL_TABLET | Freq: Every day | ORAL | 3 refills | Status: DC

## 2020-04-14 MED ORDER — DUTASTERIDE 0.5 MG PO CAPS: ORAL_CAPSULE | ORAL | 1 refills | Status: DC

## 2020-04-14 NOTE — Patient Instructions (Signed)

## 2020-04-14 NOTE — Progress Notes (Signed)
Date:  04/14/2020   Name:  Victor Walsh   DOB:  [birth year]   MRN:  [id number]   Chief Complaint: Hypertension and Hyperlipidemia  Hypertension This is a chronic problem. The current episode started more than 1 year ago. The problem has been waxing and waning since onset. The problem is uncontrolled. Pertinent negatives include no anxiety, blurred vision, chest pain, headaches, malaise/fatigue, neck pain, orthopnea, palpitations, peripheral edema, PND or sweats. There are no associated agents to hypertension. Risk factors for coronary artery disease include dyslipidemia. Past treatments include calcium channel blockers. The current treatment provides moderate improvement. Compliance problems: missed medications.  There is no history of angina, kidney disease, CAD/MI, CVA, heart failure, left ventricular hypertrophy, PVD or retinopathy. There is no history of chronic renal disease, a hypertension causing med or renovascular disease.  Hyperlipidemia This is a chronic problem. The current episode started more than 1 year ago. The problem is controlled. Recent lipid tests were reviewed and are variable. He has no history of chronic renal disease. Pertinent negatives include no chest pain, focal sensory loss, focal weakness, leg pain or myalgias. The current treatment provides moderate improvement of lipids. There are no compliance problems.  Risk factors for coronary artery disease include dyslipidemia and hypertension.  Benign Prostatic Hypertrophy This is a chronic problem. The current episode started more than 1 year ago. The problem has been gradually improving since onset. Irritative symptoms do not include frequency, nocturia or urgency. Obstructive symptoms do not include dribbling, incomplete emptying, an intermittent stream, a slower stream, straining or a weak stream. Pertinent negatives include no chills, dysuria or hematuria. Past treatments include dutasteride.    Lab Results  Component  Value Date   CREATININE 1.48 (H) 07/02/2019   BUN 21 07/02/2019   NA 142 07/02/2019   K 5.0 07/02/2019   CL 104 07/02/2019   CO2 23 07/02/2019   Lab Results  Component Value Date   CHOL 176 07/02/2019   HDL 51 07/02/2019   LDLCALC 107 (H) 07/02/2019   TRIG 97 07/02/2019   CHOLHDL 4.2 01/01/2019   No results found for: TSH No results found for: HGBA1C Lab Results  Component Value Date   WBC 5.0 10/18/2017   HGB 14.9 10/18/2017   HCT 44.8 10/18/2017   MCV 99 (H) 10/18/2017   PLT 156 10/18/2017   Lab Results  Component Value Date   ALT 19 07/02/2019   AST 19 07/02/2019   ALKPHOS 54 07/02/2019   BILITOT 0.4 07/02/2019     Review of Systems  Constitutional: Negative for chills and malaise/fatigue.  Eyes: Negative for blurred vision.  Cardiovascular: Negative for chest pain, palpitations, orthopnea and PND.  Genitourinary: Negative for dysuria, frequency, hematuria, incomplete emptying, nocturia and urgency.  Musculoskeletal: Negative for myalgias and neck pain.  Neurological: Negative for focal weakness and headaches.    Patient Active Problem List   Diagnosis Date Noted  . Benign prostatic hyperplasia with weak urinary stream 07/02/2019  . Chronic kidney disease (CKD), stage III (moderate) ([place]) 01/29/2019  . Essential hypertension 02/09/2015  . Hyperlipidemia type III 02/09/2015  . Benign prostatic hypertrophy 02/09/2015  . Vitamin D deficiency 02/09/2015    No Known Allergies  Past Surgical History:  Procedure Laterality Date  . COLONOSCOPY  2013   cleared- Dr Gustavo Lah  . COLONOSCOPY N/A 08/22/2016   Procedure: COLONOSCOPY;  Surgeon: Lollie Sails, MD;  Location: [facility] ENDOSCOPY;  Service: Endoscopy;  Laterality: N/A;  . COLONOSCOPY W/ POLYPECTOMY    .  HERNIA REPAIR      Social History   Tobacco Use  . Smoking status: Never Smoker  . Smokeless tobacco: Never Used  . Tobacco comment: smoking cessation materials not required  Vaping Use  . Vaping  Use: Never used  Substance Use Topics  . Alcohol use: No    Alcohol/week: 0.0 standard drinks  . Drug use: No     Medication list has been reviewed and updated.  Current Meds  Medication Sig  . aspirin EC 81 MG tablet Take by mouth.    PHQ 2/9 Scores 04/14/2020 01/14/2020 07/02/2019 01/01/2019  PHQ - 2 Score 0 0 0 0  PHQ- 9 Score 0 - 0 0    GAD 7 : Generalized Anxiety Score 04/14/2020 07/02/2019  Nervous, Anxious, on Edge 0 0  Control/stop worrying 0 0  Worry too much - different things 0 0  Trouble relaxing 0 0  Restless 0 0  Easily annoyed or irritable 0 0  Afraid - awful might happen 0 0  Total GAD 7 Score 0 0    BP Readings from Last 3 Encounters:  04/14/20 (!) 144/94  07/02/19 126/78  01/01/19 (!) 144/80    Physical Exam Vitals and nursing note reviewed.  Constitutional:      Appearance: He is well-developed.  HENT:     Head: Normocephalic and atraumatic.     Right Ear: Tympanic membrane, ear canal and external ear normal.     Left Ear: Tympanic membrane, ear canal and external ear normal.     Nose: Nose normal.     Mouth/Throat:     Dentition: Normal dentition.  Eyes:     General: Lids are normal. No scleral icterus.    Conjunctiva/sclera: Conjunctivae normal.     Pupils: Pupils are equal, round, and reactive to light.  Neck:     Thyroid: No thyromegaly.     Vascular: No carotid bruit, hepatojugular reflux or JVD.     Trachea: No tracheal deviation.  Cardiovascular:     Rate and Rhythm: Normal rate and regular rhythm.     Heart sounds: Normal heart sounds.  Pulmonary:     Effort: Pulmonary effort is normal.     Breath sounds: Normal breath sounds.  Abdominal:     General: Bowel sounds are normal.     Palpations: Abdomen is soft. There is no hepatomegaly, splenomegaly or mass.     Tenderness: There is no abdominal tenderness.     Hernia: There is no hernia in the left inguinal area.  Genitourinary:    Testes: Normal.     Prostate: Normal. Not  enlarged, not tender and no nodules present.     Rectum: Normal. Guaiac result negative. No mass.  Musculoskeletal:        General: Normal range of motion.     Cervical back: Normal range of motion and neck supple.  Lymphadenopathy:     Cervical: No cervical adenopathy.  Skin:    General: Skin is warm and dry.     Findings: No rash.  Neurological:     Mental Status: He is alert and oriented to person, place, and time.     Sensory: No sensory deficit.     Deep Tendon Reflexes: Reflexes are normal and symmetric.  Psychiatric:        Mood and Affect: Mood is not anxious or depressed.     Wt Readings from Last 3 Encounters:  04/14/20 194 lb (88 kg)  07/02/19 194 lb (88 kg)  01/01/19 194 lb (88 kg)    BP (!) 144/94   Pulse 84   Ht 5\' 9"  (1.753 m)   Wt 194 lb (88 kg)   BMI 28.65 kg/m   Assessment and Plan:                                                       Patient's chart was reviewed for previous encounters most recent labs most recent imaging and care everywhere. 1. Essential hypertension Chronic. Controlled. Stable. Continue continue amlodipine 5 mg once a day. Will check CMP. - Comprehensive Metabolic Panel (CMET) - amLODipine (NORVASC) 5 MG tablet; Take 1 tablet (5 mg total) by mouth daily.  Dispense: 90 tablet; Refill: 1  2. Benign prostatic hyperplasia with weak urinary stream Chronic. Controlled. Stable. Continue Avodart 0.5 daily. We will check a PSA for the current status of the prostate. DRE exam today was unremarkable for size shape and consistency. - PSA - dutasteride (AVODART) 0.5 MG capsule; TAKE ONE CAPSULE BY MOUTH DAILY  Dispense: 90 capsule; Refill: 1  3. Hyperlipidemia type III Chronic. Controlled. Stable. Continue low-cholesterol diet which was given along with a checking of the lipid panel at this time. - Lipid Panel With LDL/HDL Ratio  4. Vitamin D deficiency Chronic. Controlled. Stable. Continue vitamin D 1.25 which is 50,000 units 1 capsule every  30 days. - Vitamin D, Ergocalciferol, (DRISDOL) 1.25 MG (50000 UNIT) CAPS capsule; TAKE 1 CAPSULE BY MOUTH EVERY 30 DAYS  Dispense: 3 capsule; Refill: 1

## 2020-04-15 LAB — COMPREHENSIVE METABOLIC PANEL
ALT: 15 IU/L (ref 0–44)
AST: 17 IU/L (ref 0–40)
Albumin/Globulin Ratio: 1.5 (ref 1.2–2.2)
Albumin: 4.2 g/dL (ref 3.6–4.6)
Alkaline Phosphatase: 56 IU/L (ref 44–121)
BUN/Creatinine Ratio: 15 (ref 10–24)
BUN: 20 mg/dL (ref 8–27)
Bilirubin Total: 0.3 mg/dL (ref 0.0–1.2)
CO2: 23 mmol/L (ref 20–29)
Calcium: 9.4 mg/dL (ref 8.6–10.2)
Chloride: 103 mmol/L (ref 96–106)
Creatinine, Ser: 1.3 mg/dL — ABNORMAL HIGH (ref 0.76–1.27)
GFR calc Af Amer: 59 mL/min/{1.73_m2} — ABNORMAL LOW (ref >59)
GFR calc non Af Amer: 51 mL/min/{1.73_m2} — ABNORMAL LOW (ref >59)
Globulin, Total: 2.8 g/dL (ref 1.5–4.5)
Glucose: 80 mg/dL (ref 65–99)
Potassium: 4.7 mmol/L (ref 3.5–5.2)
Sodium: 141 mmol/L (ref 134–144)
Total Protein: 7 g/dL (ref 6.0–8.5)

## 2020-04-15 LAB — PSA: Prostate Specific Ag, Serum: 2.4 ng/mL (ref 0.0–4.0)

## 2020-04-15 LAB — LIPID PANEL WITH LDL/HDL RATIO
Cholesterol, Total: 220 mg/dL — ABNORMAL HIGH (ref 100–199)
HDL: 54 mg/dL (ref >39)
LDL Chol Calc (NIH): 151 mg/dL — ABNORMAL HIGH (ref 0–99)
LDL/HDL Ratio: 2.8 ratio (ref 0.0–3.6)
Triglycerides: 85 mg/dL (ref 0–149)
VLDL Cholesterol Cal: 15 mg/dL (ref 5–40)

## 2020-06-23 ENCOUNTER — Ambulatory Visit: Payer: Medicare PPO | Admitting: Family Medicine

## 2020-09-27 ENCOUNTER — Ambulatory Visit: Payer: Medicare PPO | Admitting: Family Medicine

## 2020-10-05 ENCOUNTER — Other Ambulatory Visit: Payer: Self-pay

## 2020-10-05 ENCOUNTER — Ambulatory Visit (INDEPENDENT_AMBULATORY_CARE_PROVIDER_SITE_OTHER): Payer: Medicare PPO | Admitting: Family Medicine

## 2020-10-05 ENCOUNTER — Encounter: Payer: Self-pay | Admitting: Family Medicine

## 2020-10-05 VITALS — BP 148/102 | HR 80 | Ht 69.0 in | Wt 194.0 lb

## 2020-10-05 DIAGNOSIS — E782 Mixed hyperlipidemia: Secondary | ICD-10-CM | POA: Diagnosis not present

## 2020-10-05 DIAGNOSIS — R3912 Poor urinary stream: Secondary | ICD-10-CM

## 2020-10-05 DIAGNOSIS — I10 Essential (primary) hypertension: Secondary | ICD-10-CM | POA: Diagnosis not present

## 2020-10-05 DIAGNOSIS — N401 Enlarged prostate with lower urinary tract symptoms: Secondary | ICD-10-CM | POA: Diagnosis not present

## 2020-10-05 DIAGNOSIS — E559 Vitamin D deficiency, unspecified: Secondary | ICD-10-CM

## 2020-10-05 DIAGNOSIS — Z23 Encounter for immunization: Secondary | ICD-10-CM

## 2020-10-05 MED ORDER — DUTASTERIDE 0.5 MG PO CAPS: ORAL_CAPSULE | ORAL | 1 refills | Status: DC

## 2020-10-05 MED ORDER — AMLODIPINE BESYLATE 5 MG PO TABS: 5.0000 mg | ORAL_TABLET | Freq: Every day | ORAL | 1 refills | Status: DC

## 2020-10-05 MED ORDER — ATORVASTATIN CALCIUM 10 MG PO TABS
10.0000 mg | ORAL_TABLET | Freq: Every day | ORAL | 1 refills | Status: DC
Start: 2020-10-05 — End: 2021-04-26

## 2020-10-05 MED ORDER — VITAMIN D (ERGOCALCIFEROL) 1.25 MG (50000 UNIT) PO CAPS: ORAL_CAPSULE | ORAL | 1 refills | Status: DC

## 2020-10-05 NOTE — Progress Notes (Signed)
Date:  10/05/2020   Name:  Victor Walsh   DOB:  [birth year]   MRN:  [id number]   Chief Complaint: Hypertension, Hyperlipidemia, and vitamin d def  Hypertension This is a chronic problem. The current episode started more than 1 year ago. The problem has been gradually improving since onset. The problem is controlled. Pertinent negatives include no anxiety, blurred vision, chest pain, headaches, malaise/fatigue, neck pain, orthopnea, palpitations, peripheral edema, PND, shortness of breath or sweats. There are no associated agents to hypertension. There are no known risk factors for coronary artery disease. Past treatments include calcium channel blockers. The current treatment provides moderate improvement. There are no compliance problems.  There is no history of angina, kidney disease, CAD/MI, CVA, heart failure, left ventricular hypertrophy, PVD or retinopathy. There is no history of chronic renal disease, a hypertension causing med or renovascular disease.  Hyperlipidemia This is a chronic problem. The current episode started more than 1 year ago. The problem is controlled. Recent lipid tests were reviewed and are normal. He has no history of chronic renal disease, diabetes, hypothyroidism, liver disease, obesity or nephrotic syndrome. Pertinent negatives include no chest pain, focal sensory loss, focal weakness, leg pain, myalgias or shortness of breath. Current antihyperlipidemic treatment includes statins. The current treatment provides moderate improvement of lipids. There are no compliance problems.  Risk factors for coronary artery disease include dyslipidemia, hypertension and male sex.  Male GU Problem The patient's pertinent negatives include no genital injury, genital itching, genital lesions, pelvic pain, penile discharge, penile pain, priapism, scrotal swelling or testicular pain. Primary symptoms comment: BPH. This is a chronic problem. The current episode started more than 1 year ago.  The problem occurs constantly. The problem has been gradually improving. The pain is moderate. Pertinent negatives include no abdominal pain, chest pain, chills, constipation, coughing, diarrhea, dysuria, fever, flank pain, frequency, headaches, nausea, rash, shortness of breath, sore throat or urgency. The treatment provided moderate relief.    Lab Results  Component Value Date   CREATININE 1.30 (H) 04/14/2020   BUN 20 04/14/2020   NA 141 04/14/2020   K 4.7 04/14/2020   CL 103 04/14/2020   CO2 23 04/14/2020   Lab Results  Component Value Date   CHOL 220 (H) 04/14/2020   HDL 54 04/14/2020   LDLCALC 151 (H) 04/14/2020   TRIG 85 04/14/2020   CHOLHDL 4.2 01/01/2019   No results found for: TSH No results found for: HGBA1C Lab Results  Component Value Date   WBC 5.0 10/18/2017   HGB 14.9 10/18/2017   HCT 44.8 10/18/2017   MCV 99 (H) 10/18/2017   PLT 156 10/18/2017   Lab Results  Component Value Date   ALT 15 04/14/2020   AST 17 04/14/2020   ALKPHOS 56 04/14/2020   BILITOT 0.3 04/14/2020     Review of Systems  Constitutional: Negative for chills, fever and malaise/fatigue.  HENT: Negative for drooling, ear discharge, ear pain and sore throat.   Eyes: Negative for blurred vision.  Respiratory: Negative for cough, shortness of breath and wheezing.   Cardiovascular: Negative for chest pain, palpitations, orthopnea, leg swelling and PND.  Gastrointestinal: Negative for abdominal pain, blood in stool, constipation, diarrhea and nausea.  Endocrine: Negative for polydipsia.  Genitourinary: Negative for difficulty urinating, dysuria, flank pain, frequency, hematuria, pelvic pain, penile discharge, penile pain, scrotal swelling, testicular pain and urgency.  Musculoskeletal: Negative for back pain, myalgias and neck pain.  Skin: Negative for rash.  Allergic/Immunologic: Negative  for environmental allergies.  Neurological: Negative for dizziness, focal weakness and headaches.   Hematological: Does not bruise/bleed easily.  Psychiatric/Behavioral: Negative for suicidal ideas. The patient is not nervous/anxious.     Patient Active Problem List   Diagnosis Date Noted  . Benign prostatic hyperplasia with weak urinary stream 07/02/2019  . Chronic kidney disease (CKD), stage III (moderate) ([place]) 01/29/2019  . Essential hypertension 02/09/2015  . Hyperlipidemia type III 02/09/2015  . Benign prostatic hypertrophy 02/09/2015  . Vitamin D deficiency 02/09/2015    No Known Allergies  Past Surgical History:  Procedure Laterality Date  . COLONOSCOPY  2013   cleared- Dr Gustavo Lah  . COLONOSCOPY N/A 08/22/2016   Procedure: COLONOSCOPY;  Surgeon: Lollie Sails, MD;  Location: [facility] ENDOSCOPY;  Service: Endoscopy;  Laterality: N/A;  . COLONOSCOPY W/ POLYPECTOMY    . HERNIA REPAIR      Social History   Tobacco Use  . Smoking status: Never Smoker  . Smokeless tobacco: Never Used  . Tobacco comment: smoking cessation materials not required  Vaping Use  . Vaping Use: Never used  Substance Use Topics  . Alcohol use: No    Alcohol/week: 0.0 standard drinks  . Drug use: No     Medication list has been reviewed and updated.  Current Meds  Medication Sig  . amLODipine (NORVASC) 5 MG tablet Take 1 tablet (5 mg total) by mouth daily.  Marland Kitchen aspirin EC 81 MG tablet Take by mouth.  Marland Kitchen atorvastatin (LIPITOR) 10 MG tablet Take 1 tablet (10 mg total) by mouth daily.  Marland Kitchen dutasteride (AVODART) 0.5 MG capsule TAKE ONE CAPSULE BY MOUTH DAILY  . Vitamin D, Ergocalciferol, (DRISDOL) 1.25 MG (50000 UNIT) CAPS capsule TAKE 1 CAPSULE BY MOUTH EVERY 30 DAYS    PHQ 2/9 Scores 10/05/2020 04/14/2020 01/14/2020 07/02/2019  PHQ - 2 Score 0 0 0 0  PHQ- 9 Score 0 0 - 0    GAD 7 : Generalized Anxiety Score 10/05/2020 04/14/2020 07/02/2019  Nervous, Anxious, on Edge 0 0 0  Control/stop worrying 0 0 0  Worry too much - different things 0 0 0  Trouble relaxing 0 0 0  Restless 0 0 0  Easily  annoyed or irritable 0 0 0  Afraid - awful might happen 0 0 0  Total GAD 7 Score 0 0 0    BP Readings from Last 3 Encounters:  10/05/20 (!) 148/102  04/14/20 (!) 144/94  07/02/19 126/78    Physical Exam Vitals and nursing note reviewed.  HENT:     Head: Normocephalic.     Right Ear: Tympanic membrane, ear canal and external ear normal.     Left Ear: Tympanic membrane, ear canal and external ear normal.     Nose: Nose normal. No congestion or rhinorrhea.     Mouth/Throat:     Mouth: Mucous membranes are moist.  Eyes:     General: No scleral icterus.       Right eye: No discharge.        Left eye: No discharge.     Conjunctiva/sclera: Conjunctivae normal.     Pupils: Pupils are equal, round, and reactive to light.  Neck:     Thyroid: No thyromegaly.     Vascular: No JVD.     Trachea: No tracheal deviation.  Cardiovascular:     Rate and Rhythm: Normal rate and regular rhythm.     Heart sounds: Normal heart sounds. No murmur heard. No friction rub. No gallop.  Pulmonary:     Effort: No respiratory distress.     Breath sounds: Normal breath sounds. No wheezing, rhonchi or rales.  Abdominal:     General: Bowel sounds are normal.     Palpations: Abdomen is soft. There is no mass.     Tenderness: There is no abdominal tenderness. There is no guarding or rebound.  Genitourinary:    Prostate: Normal.     Rectum: Normal. Guaiac result negative.  Musculoskeletal:        General: No tenderness. Normal range of motion.     Cervical back: Normal range of motion and neck supple.  Lymphadenopathy:     Cervical: No cervical adenopathy.  Skin:    General: Skin is warm.     Findings: No rash.  Neurological:     Mental Status: He is alert and oriented to person, place, and time.     Cranial Nerves: No cranial nerve deficit.     Deep Tendon Reflexes: Reflexes are normal and symmetric.     Wt Readings from Last 3 Encounters:  10/05/20 194 lb (88 kg)  04/14/20 194 lb (88 kg)   07/02/19 194 lb (88 kg)    BP (!) 148/102   Pulse 80   Ht 5\' 9"  (1.753 m)   Wt 194 lb (88 kg)   BMI 28.65 kg/m   Assessment and Plan:  1. Essential hypertension Chronic.  Controlled.  Stable.  Blood pressure 148/102.  Patient will continue amlodipine 5 mg once a day and recheck in 6 weeks.  We will check renal function panel. - amLODipine (NORVASC) 5 MG tablet; Take 1 tablet (5 mg total) by mouth daily.  Dispense: 90 tablet; Refill: 1 - Renal Function Panel  2. Benign prostatic hyperplasia with weak urinary stream .  Controlled.  Stable.  Continue Avodart 0.5 mg 1 capsule daily. - dutasteride (AVODART) 0.5 MG capsule; TAKE ONE CAPSULE BY MOUTH DAILY  Dispense: 90 capsule; Refill: 1 - PSA  3. Vitamin D deficiency Chronic.  Controlled.  Stable.  Continue vitamin D 1.25 mg to 50,000 units every 30 days. - Vitamin D, Ergocalciferol, (DRISDOL) 1.25 MG (50000 UNIT) CAPS capsule; TAKE 1 CAPSULE BY MOUTH EVERY 30 DAYS  Dispense: 3 capsule; Refill: 1  4. Hyperlipidemia type III Chronic.  Controlled.  Stable.  Will check lipid panel for current level. - Lipid Panel With LDL/HDL Ratio  5. Need for pneumococcal vaccine Discussed pneumococcal vaccine and patient has elected to proceed with pneumococcal 20. - Pneumococcal conjugate vaccine 20-valent (Prevnar 20)

## 2020-10-06 DIAGNOSIS — Z20822 Contact with and (suspected) exposure to covid-19: Secondary | ICD-10-CM | POA: Diagnosis not present

## 2020-10-06 LAB — LIPID PANEL WITH LDL/HDL RATIO
Cholesterol, Total: 144 mg/dL (ref 100–199)
HDL: 55 mg/dL (ref >39)
LDL Chol Calc (NIH): 79 mg/dL (ref 0–99)
LDL/HDL Ratio: 1.4 ratio (ref 0.0–3.6)
Triglycerides: 45 mg/dL (ref 0–149)
VLDL Cholesterol Cal: 10 mg/dL (ref 5–40)

## 2020-10-06 LAB — RENAL FUNCTION PANEL
Albumin: 4.2 g/dL (ref 3.6–4.6)
BUN/Creatinine Ratio: 11 (ref 10–24)
BUN: 15 mg/dL (ref 8–27)
CO2: 22 mmol/L (ref 20–29)
Calcium: 8.6 mg/dL (ref 8.6–10.2)
Chloride: 101 mmol/L (ref 96–106)
Creatinine, Ser: 1.38 mg/dL — ABNORMAL HIGH (ref 0.76–1.27)
Glucose: 96 mg/dL (ref 65–99)
Phosphorus: 3.2 mg/dL (ref 2.8–4.1)
Potassium: 4.3 mmol/L (ref 3.5–5.2)
Sodium: 139 mmol/L (ref 134–144)
eGFR: 51 mL/min/{1.73_m2} — ABNORMAL LOW (ref >59)

## 2020-10-06 LAB — PSA: Prostate Specific Ag, Serum: 1.9 ng/mL (ref 0.0–4.0)

## 2021-01-17 ENCOUNTER — Ambulatory Visit: Payer: Medicare PPO

## 2021-01-27 ENCOUNTER — Telehealth: Payer: Self-pay | Admitting: Family Medicine

## 2021-01-27 NOTE — Telephone Encounter (Signed)
Copied from [place] [id number]. Topic: Medicare AWV >> Jan 27, 2021 10:09 AM Cher Nakai R wrote: Reason for CRM:  Left message for patient to call back and schedule Medicare Annual Wellness Visit (AWV) in office.   If unable to come into the office for AWV,  please offer to do virtually or by telephone.  Last AWV: 01/14/2020  Please schedule at anytime with [facility] Health Advisor.  40 minute appointment  Any questions, please contact me at [phone]

## 2021-02-21 ENCOUNTER — Other Ambulatory Visit: Payer: Self-pay

## 2021-02-21 ENCOUNTER — Ambulatory Visit (INDEPENDENT_AMBULATORY_CARE_PROVIDER_SITE_OTHER): Payer: Medicare PPO

## 2021-02-21 VITALS — BP 142/88 | HR 72 | Temp 98.2°F | Resp 15 | Ht 69.0 in | Wt 192.6 lb

## 2021-02-21 DIAGNOSIS — Z0000 Encounter for general adult medical examination without abnormal findings: Secondary | ICD-10-CM

## 2021-02-21 NOTE — Progress Notes (Signed)
Subjective:   Victor Walsh is a 85 y.o. male who presents for Medicare Annual/Subsequent preventive examination.  Review of Systems     Cardiac Risk Factors include: advanced age (>44men, >20 women);dyslipidemia;hypertension;male gender     Objective:    Today's Vitals   02/21/21 1521  BP: (!) 142/88  Pulse: 72  Resp: 15  Temp: 98.2 F (36.8 C)  TempSrc: Oral  SpO2: 98%  Weight: 192 lb 9.6 oz (87.4 kg)  Height: 5\' 9"  (1.753 m)   Body mass index is 28.44 kg/m.  Advanced Directives 02/21/2021 01/14/2020 01/01/2019 09/26/2017 08/22/2016 02/09/2015  Does Patient Have a Medical Advance Directive? No No No No No Yes  Type of Advance Directive - - - - - Living will;Healthcare Power of [place] in Chart? - - - - - No - copy requested  Would patient like information on creating a medical advance directive? No - Patient declined No - Patient declined No - Patient declined Yes (MAU/Ambulatory/Procedural Areas - Information given) - -    Current Medications (verified) Outpatient Encounter Medications as of 02/21/2021  Medication Sig   amLODipine (NORVASC) 5 MG tablet Take 1 tablet (5 mg total) by mouth daily.   aspirin EC 81 MG tablet Take by mouth.   atorvastatin (LIPITOR) 10 MG tablet Take 1 tablet (10 mg total) by mouth daily.   dutasteride (AVODART) 0.5 MG capsule TAKE ONE CAPSULE BY MOUTH DAILY   Vitamin D, Ergocalciferol, (DRISDOL) 1.25 MG (50000 UNIT) CAPS capsule TAKE 1 CAPSULE BY MOUTH EVERY 30 DAYS   No facility-administered encounter medications on file as of 02/21/2021.    Allergies (verified) Patient has no known allergies.   History: Past Medical History:  Diagnosis Date   BPH (benign prostatic hyperplasia)    Chronic kidney disease    Hyperlipidemia    Hypertension    Stomach ulcer    Past Surgical History:  Procedure Laterality Date   COLONOSCOPY  2013   cleared- Dr Gustavo Lah   COLONOSCOPY N/A 08/22/2016   Procedure:  COLONOSCOPY;  Surgeon: Lollie Sails, MD;  Location: [facility] ENDOSCOPY;  Service: Endoscopy;  Laterality: N/A;   COLONOSCOPY W/ POLYPECTOMY     HERNIA REPAIR     Family History  Problem Relation Age of Onset   Kidney disease Mother    Cancer Father        stomach cancer   Social History   Socioeconomic History   Marital status: Divorced    Spouse name: Not on file   Number of children: 1   Years of education: Not on file   Highest education level: Master's degree (e.g., MA, MS, MEng, MEd, MSW, MBA)  Occupational History    Employer: [employer]  Tobacco Use   Smoking status: Never   Smokeless tobacco: Never   Tobacco comments:    smoking cessation materials not required  Vaping Use   Vaping Use: Never used  Substance and Sexual Activity   Alcohol use: No    Alcohol/week: 0.0 standard drinks   Drug use: No   Sexual activity: Never  Other Topics Concern   Not on file  Social History Narrative   Pt lives alone   Social Determinants of Health   Financial Resource Strain: Low Risk    Difficulty of Paying Living Expenses: Not hard at all  Food Insecurity: No Food Insecurity   Worried About Estate manager/land agent of Food in the Last Year: Never true  Ran Out of Food in the Last Year: Never true  Transportation Needs: No Transportation Needs   Lack of Transportation (Medical): No   Lack of Transportation (Non-Medical): No  Physical Activity: Insufficiently Active   Days of Exercise per Week: 1 day   Minutes of Exercise per Session: 30 min  Stress: No Stress Concern Present   Feeling of Stress : Not at all  Social Connections: Socially Isolated   Frequency of Communication with Friends and Family: More than three times a week   Frequency of Social Gatherings with Friends and Family: Three times a week   Attends Religious Services: Never   Active Member of Clubs or Organizations: No   Attends Archivist Meetings: Never   Marital Status: Divorced     Tobacco Counseling Counseling given: Not Answered Tobacco comments: smoking cessation materials not required   Clinical Intake:  Pre-visit preparation completed: Yes  Pain : No/denies pain     BMI - recorded: 28.44 Nutritional Status: BMI 25 -29 Overweight Nutritional Risks: None Diabetes: No  How often do you need to have someone help you when you read instructions, pamphlets, or other written materials from your doctor or pharmacy?: 1 - Never    Interpreter Needed?: No  Information entered by :: [name] LPN   Activities of Daily Living In your present state of health, do you have any difficulty performing the following activities: 02/21/2021  Hearing? N  Vision? N  Difficulty concentrating or making decisions? N  Walking or climbing stairs? N  Dressing or bathing? N  Doing errands, shopping? N  Preparing Food and eating ? N  Using the Toilet? N  In the past six months, have you accidently leaked urine? N  Do you have problems with loss of bowel control? N  Managing your Medications? N  Managing your Finances? N  Housekeeping or managing your Housekeeping? N  Some recent data might be hidden    Patient Care Team: Juline Patch, MD as PCP - General (Family Medicine) Lollie Sails, MD (Inactive) as Consulting Physician (Gastroenterology)  Indicate any recent Medical Services you may have received from other than [facility] providers in the past year (date may be approximate).     Assessment:   This is a routine wellness examination for Victor Walsh.  Hearing/Vision screen Hearing Screening - Comments:: Pt denies hearing difficulty Vision Screening - Comments:: Vision screenings done at [facility]. Does not go annually  Dietary issues and exercise activities discussed: Current Exercise Habits: Home exercise routine, Type of exercise: walking, Time (Minutes): 30, Frequency (Times/Week): 1, Weekly Exercise (Minutes/Week): 30, Intensity: Mild, Exercise  limited by: None identified   Goals Addressed             This Visit's Progress    DIET - INCREASE WATER INTAKE   On track    Recommend to drink at least 6-8 8oz glasses of water per day.       Depression Screen PHQ 2/9 Scores 02/21/2021 10/05/2020 04/14/2020 01/14/2020 07/02/2019 01/01/2019 10/18/2017  PHQ - 2 Score 0 0 0 0 0 0 0  PHQ- 9 Score - 0 0 - 0 0 0    Fall Risk Fall Risk  02/21/2021 04/14/2020 01/14/2020 07/02/2019 09/26/2017  Falls in the past year? 0 0 0 0 No  Number falls in past yr: 0 - 0 - -  Injury with Fall? 0 - 0 - -  Risk for fall due to : No Fall Risks - No Fall Risks -  Impaired vision  Risk for fall due to: Comment - - - - wears eyeglasses  Follow up Falls prevention discussed Falls evaluation completed Falls prevention discussed Falls evaluation completed -    FALL RISK PREVENTION PERTAINING TO THE HOME:  Any stairs in or around the home? Yes  If so, are there any without handrails? No  Home free of loose throw rugs in walkways, pet beds, electrical cords, etc? Yes  Adequate lighting in your home to reduce risk of falls? Yes   ASSISTIVE DEVICES UTILIZED TO PREVENT FALLS:  Life alert? No  Use of a cane, walker or w/c? No  Grab bars in the bathroom? No Shower chair or bench in shower? No  Elevated toilet seat or a handicapped toilet? No   TIMED UP AND GO:  Was the test performed? Yes .  Length of time to ambulate 10 feet: 5 sec.   Gait steady and fast without use of assistive device  Cognitive Function:     6CIT Screen 04/14/2020 01/14/2020 01/01/2019 09/26/2017  What Year? 0 points 0 points 0 points 0 points  What month? 0 points 0 points 0 points 0 points  What time? 0 points 0 points 0 points 0 points  Count back from 20 0 points 0 points 0 points 0 points  Months in reverse 0 points 0 points 0 points 0 points  Repeat phrase 0 points 0 points 0 points 2 points  Total Score 0 0 0 2    Immunizations Immunization History  Administered Date(s)  Administered   Influenza Split 02/09/2015   Influenza, High Dose Seasonal PF 02/21/2017, 02/04/2018   Influenza,inj,Quad PF,6+ Mos 02/09/2015   Influenza-Unspecified 01/21/2019, 01/21/2020, 01/24/2021   Moderna SARS-COV2 Booster Vaccination 10/13/2020   PFIZER(Purple Top)SARS-COV-2 Vaccination 06/26/2019, 07/17/2019, 03/23/2020   PNEUMOCOCCAL CONJUGATE-20 10/05/2020   Pneumococcal Conjugate-13 02/09/2015   Pneumococcal Polysaccharide-23 05/22/2006   Tdap 09/14/2015    TDAP status: Up to date  Flu Vaccine status: Up to date  Pneumococcal vaccine status: Up to date  Covid-19 vaccine status: Completed vaccines  Qualifies for Shingles Vaccine? Yes   Zostavax completed No   Shingrix Completed?: No.    Education has been provided regarding the importance of this vaccine. Patient has been advised to call insurance company to determine out of pocket expense if they have not yet received this vaccine. Advised may also receive vaccine at local pharmacy or Health Dept. Verbalized acceptance and understanding.  Screening Tests Health Maintenance  Topic Date Due   Zoster Vaccines- Shingrix (1 of 2) 05/22/2021 (Originally 03/18/1986)   TETANUS/TDAP  09/13/2025   INFLUENZA VACCINE  Completed   COVID-19 Vaccine  Completed   HPV VACCINES  Aged Out    Health Maintenance  There are no preventive care reminders to display for this patient.   Colorectal cancer screening: No longer required.   Lung Cancer Screening: (Low Dose CT Chest recommended if Age 82-80 years, 30 pack-year currently smoking OR have quit w/in 15years.) does not qualify.   Additional Screening:  Hepatitis C Screening: does not qualify.  Vision Screening: Recommended annual ophthalmology exams for early detection of glaucoma and other disorders of the eye. Is the patient up to date with their annual eye exam?  No  Who is the provider or what is the name of the office in which the patient attends annual eye exams? Dr.  Wyatt Portela.   Dental Screening: Recommended annual dental exams for proper oral hygiene  Community Resource Referral / Chronic Care Management: CRR  required this visit?  No   CCM required this visit?  No      Plan:     I have personally reviewed and noted the following in the patient's chart:   Medical and social history Use of alcohol, tobacco or illicit drugs  Current medications and supplements including opioid prescriptions. Patient is not currently taking opioid prescriptions. Functional ability and status Nutritional status Physical activity Advanced directives List of other physicians Hospitalizations, surgeries, and ER visits in previous 12 months Vitals Screenings to include cognitive, depression, and falls Referrals and appointments  In addition, I have reviewed and discussed with patient certain preventive protocols, quality metrics, and best practice recommendations. A written personalized care plan for preventive services as well as general preventive health recommendations were provided to patient.     [name], LPN   02/20/5485   Nurse Notes: none

## 2021-02-21 NOTE — Patient Instructions (Signed)
Victor Walsh , Thank you for taking time to come for your Medicare Wellness Visit. I appreciate your ongoing commitment to your health goals. Please review the following plan we discussed and let me know if I can assist you in the future.   Screening recommendations/referrals: Colonoscopy: no longer required Recommended yearly ophthalmology/optometry visit for glaucoma screening and checkup Recommended yearly dental visit for hygiene and checkup  Vaccinations: Influenza vaccine: done 01/24/21 Pneumococcal vaccine: done 10/05/20 Tdap vaccine: done 09/14/15 Shingles vaccine: Shingrix discussed. Please contact your pharmacy for coverage information.  Covid-19: done 06/26/19, 07/17/19, 03/23/20 & 10/13/20  Advanced directives: Advance directive discussed with you today. Even though you declined this today please call our office should you change your mind and we can give you the proper paperwork for you to fill out.   Conditions/risks identified: Keep up the great work!  Next appointment: Follow up in one year for your annual wellness visit.   Preventive Care 53 Years and Older, Male Preventive care refers to lifestyle choices and visits with your health care provider that can promote health and wellness. What does preventive care include? A yearly physical exam. This is also called an annual well check. Dental exams once or twice a year. Routine eye exams. Ask your health care provider how often you should have your eyes checked. Personal lifestyle choices, including: Daily care of your teeth and gums. Regular physical activity. Eating a healthy diet. Avoiding tobacco and drug use. Limiting alcohol use. Practicing safe sex. Taking low doses of aspirin every day. Taking vitamin and mineral supplements as recommended by your health care provider. What happens during an annual well check? The services and screenings done by your health care provider during your annual well check will depend on  your age, overall health, lifestyle risk factors, and family history of disease. Counseling  Your health care provider may ask you questions about your: Alcohol use. Tobacco use. Drug use. Emotional well-being. Home and relationship well-being. Sexual activity. Eating habits. History of falls. Memory and ability to understand (cognition). Work and work Statistician. Screening  You may have the following tests or measurements: Height, weight, and BMI. Blood pressure. Lipid and cholesterol levels. These may be checked every 5 years, or more frequently if you are over 66 years old. Skin check. Lung cancer screening. You may have this screening every year starting at age 9 if you have a 30-pack-year history of smoking and currently smoke or have quit within the past 15 years. Fecal occult blood test (FOBT) of the stool. You may have this test every year starting at age 59. Flexible sigmoidoscopy or colonoscopy. You may have a sigmoidoscopy every 5 years or a colonoscopy every 10 years starting at age 1. Prostate cancer screening. Recommendations will vary depending on your family history and other risks. Hepatitis C blood test. Hepatitis B blood test. Sexually transmitted disease (STD) testing. Diabetes screening. This is done by checking your blood sugar (glucose) after you have not eaten for a while (fasting). You may have this done every 1-3 years. Abdominal aortic aneurysm (AAA) screening. You may need this if you are a current or former smoker. Osteoporosis. You may be screened starting at age 72 if you are at high risk. Talk with your health care provider about your test results, treatment options, and if necessary, the need for more tests. Vaccines  Your health care provider may recommend certain vaccines, such as: Influenza vaccine. This is recommended every year. Tetanus, diphtheria, and acellular pertussis (Tdap,  Td) vaccine. You may need a Td booster every 10 years. Zoster  vaccine. You may need this after age 85. Pneumococcal 13-valent conjugate (PCV13) vaccine. One dose is recommended after age 35. Pneumococcal polysaccharide (PPSV23) vaccine. One dose is recommended after age 68. Talk to your health care provider about which screenings and vaccines you need and how often you need them. This information is not intended to replace advice given to you by your health care provider. Make sure you discuss any questions you have with your health care provider. Document Released: 06/04/2015 Document Revised: 01/26/2016 Document Reviewed: 03/09/2015 Elsevier Interactive Patient Education  2017 [place] Prevention in the Home Falls can cause injuries. They can happen to people of all ages. There are many things you can do to make your home safe and to help prevent falls. What can I do on the outside of my home? Regularly fix the edges of walkways and driveways and fix any cracks. Remove anything that might make you trip as you walk through a door, such as a raised step or threshold. Trim any bushes or trees on the path to your home. Use bright outdoor lighting. Clear any walking paths of anything that might make someone trip, such as rocks or tools. Regularly check to see if handrails are loose or broken. Make sure that both sides of any steps have handrails. Any raised decks and porches should have guardrails on the edges. Have any leaves, snow, or ice cleared regularly. Use sand or salt on walking paths during winter. Clean up any spills in your garage right away. This includes oil or grease spills. What can I do in the bathroom? Use night lights. Install grab bars by the toilet and in the tub and shower. Do not use towel bars as grab bars. Use non-skid mats or decals in the tub or shower. If you need to sit down in the shower, use a plastic, non-slip stool. Keep the floor dry. Clean up any water that spills on the floor as soon as it happens. Remove  soap buildup in the tub or shower regularly. Attach bath mats securely with double-sided non-slip rug tape. Do not have throw rugs and other things on the floor that can make you trip. What can I do in the bedroom? Use night lights. Make sure that you have a light by your bed that is easy to reach. Do not use any sheets or blankets that are too big for your bed. They should not hang down onto the floor. Have a firm chair that has side arms. You can use this for support while you get dressed. Do not have throw rugs and other things on the floor that can make you trip. What can I do in the kitchen? Clean up any spills right away. Avoid walking on wet floors. Keep items that you use a lot in easy-to-reach places. If you need to reach something above you, use a strong step stool that has a grab bar. Keep electrical cords out of the way. Do not use floor polish or wax that makes floors slippery. If you must use wax, use non-skid floor wax. Do not have throw rugs and other things on the floor that can make you trip. What can I do with my stairs? Do not leave any items on the stairs. Make sure that there are handrails on both sides of the stairs and use them. Fix handrails that are broken or loose. Make sure that handrails are as  long as the stairways. Check any carpeting to make sure that it is firmly attached to the stairs. Fix any carpet that is loose or worn. Avoid having throw rugs at the top or bottom of the stairs. If you do have throw rugs, attach them to the floor with carpet tape. Make sure that you have a light switch at the top of the stairs and the bottom of the stairs. If you do not have them, ask someone to add them for you. What else can I do to help prevent falls? Wear shoes that: Do not have high heels. Have rubber bottoms. Are comfortable and fit you well. Are closed at the toe. Do not wear sandals. If you use a stepladder: Make sure that it is fully opened. Do not climb a  closed stepladder. Make sure that both sides of the stepladder are locked into place. Ask someone to hold it for you, if possible. Clearly mark and make sure that you can see: Any grab bars or handrails. First and last steps. Where the edge of each step is. Use tools that help you move around (mobility aids) if they are needed. These include: Canes. Walkers. Scooters. Crutches. Turn on the lights when you go into a dark area. Replace any light bulbs as soon as they burn out. Set up your furniture so you have a clear path. Avoid moving your furniture around. If any of your floors are uneven, fix them. If there are any pets around you, be aware of where they are. Review your medicines with your doctor. Some medicines can make you feel dizzy. This can increase your chance of falling. Ask your doctor what other things that you can do to help prevent falls. This information is not intended to replace advice given to you by your health care provider. Make sure you discuss any questions you have with your health care provider. Document Released: 03/04/2009 Document Revised: 10/14/2015 Document Reviewed: 06/12/2014 Elsevier Interactive Patient Education  2017 [employer].

## 2021-03-05 ENCOUNTER — Other Ambulatory Visit: Payer: Self-pay | Admitting: Family Medicine

## 2021-03-05 DIAGNOSIS — N401 Enlarged prostate with lower urinary tract symptoms: Secondary | ICD-10-CM

## 2021-03-05 DIAGNOSIS — R3912 Poor urinary stream: Secondary | ICD-10-CM

## 2021-03-05 NOTE — Telephone Encounter (Signed)
Requested Prescriptions  Pending Prescriptions Disp Refills  . dutasteride (AVODART) 0.5 MG capsule [Pharmacy Med Name: DUTASTERIDE 0.5MG  CAPSULES] 90 capsule 2    Sig: TAKE 1 CAPSULE BY MOUTH DAILY     Urology: 5-alpha Reductase Inhibitors Passed - 03/05/2021  6:22 AM      Passed - Valid encounter within last 12 months    Recent Outpatient Visits          5 months ago Essential hypertension   [facility] Juline Patch, MD   10 months ago Essential hypertension   [facility] Juline Patch, MD   1 year ago Essential hypertension   [facility] Juline Patch, MD   2 years ago Essential hypertension   [facility] Juline Patch, MD   3 years ago Essential hypertension   [facility] Juline Patch, MD

## 2021-04-26 ENCOUNTER — Other Ambulatory Visit: Payer: Self-pay | Admitting: Family Medicine

## 2021-04-26 DIAGNOSIS — I10 Essential (primary) hypertension: Secondary | ICD-10-CM

## 2021-04-26 DIAGNOSIS — E559 Vitamin D deficiency, unspecified: Secondary | ICD-10-CM

## 2021-04-26 NOTE — Telephone Encounter (Signed)
Requested medications are due for refill today.  No - Amlodipine was filled 04/26/2021. Vit D - yes  Requested medications are on the active medications list.  yes  Last refill. Amlodipine  - 04/26/2021, Vit D - 10/05/2020  Future visit scheduled.   no  Notes to clinic.  Vit D is not delegated. Amlodipine was just filled.    Requested Prescriptions  Pending Prescriptions Disp Refills   amLODipine (NORVASC) 5 MG tablet [Pharmacy Med Name: AMLODIPINE BESYLATE 5MG  TABLETS] 90 tablet 1    Sig: TAKE 1 TABLET(5 MG) BY MOUTH DAILY     Cardiovascular:  Calcium Channel Blockers Failed - 04/26/2021  2:05 PM      Failed - Last BP in normal range    BP Readings from Last 1 Encounters:  02/21/21 (!) 142/88          Failed - Valid encounter within last 6 months    Recent Outpatient Visits           6 months ago Essential hypertension   [place], Deanna C, MD   1 year ago Essential hypertension   [place], Deanna C, MD   1 year ago Essential hypertension   [place], Deanna C, MD   2 years ago Essential hypertension   [place], Deanna C, MD   3 years ago Essential hypertension   [place], Deanna C, MD               Vitamin D, Ergocalciferol, (DRISDOL) 1.25 MG (50000 UNIT) CAPS capsule [Pharmacy Med Name: VITAMIN D2 50,000IU (ERGO) CAP RX] 3 capsule 1    Sig: TAKE 1 CAPSULE BY MOUTH EVERY 30 DAYS     Endocrinology:  Vitamins - Vitamin D Supplementation Failed - 04/26/2021  2:05 PM      Failed - 50,000 IU strengths are not delegated      Failed - Vitamin D in normal range and within 360 days    No results found for: [id number], [id number], [id number], [place], [place], 25OHVITD3, 25OHVITD2, 25OHVITD1, 25OHVITD2, 25OHVITD3, VD25OH        Passed - Ca in normal range and within 360 days    Calcium  Date Value Ref Range Status  10/05/2020 8.6 8.6 - 10.2 mg/dL Final   Calcium, Total   Date Value Ref Range Status  06/03/2012 8.9 8.5 - 10.1 mg/dL Final          Passed - Phosphate in normal range and within 360 days    Phosphorus  Date Value Ref Range Status  10/05/2020 3.2 2.8 - 4.1 mg/dL Final          Passed - Valid encounter within last 12 months    Recent Outpatient Visits           6 months ago Essential hypertension   [facility] Juline Patch, MD   1 year ago Essential hypertension   [facility] Juline Patch, MD   1 year ago Essential hypertension   [facility] Juline Patch, MD   2 years ago Essential hypertension   [facility] Juline Patch, MD   3 years ago Essential hypertension   [facility] Juline Patch, MD

## 2021-04-26 NOTE — Telephone Encounter (Signed)
Pt needs appt, left VM to CB to secure.  #30 courtesy refill given

## 2021-05-09 ENCOUNTER — Ambulatory Visit: Payer: Medicare PPO | Admitting: Family Medicine

## 2021-05-13 ENCOUNTER — Ambulatory Visit: Payer: Medicare PPO | Admitting: Family Medicine

## 2021-05-17 ENCOUNTER — Other Ambulatory Visit: Payer: Self-pay

## 2021-05-17 ENCOUNTER — Ambulatory Visit: Payer: Medicare PPO | Admitting: Family Medicine

## 2021-05-17 ENCOUNTER — Encounter: Payer: Self-pay | Admitting: Family Medicine

## 2021-05-17 VITALS — BP 138/64 | HR 80 | Ht 69.0 in | Wt 193.0 lb

## 2021-05-17 DIAGNOSIS — E559 Vitamin D deficiency, unspecified: Secondary | ICD-10-CM

## 2021-05-17 DIAGNOSIS — N401 Enlarged prostate with lower urinary tract symptoms: Secondary | ICD-10-CM

## 2021-05-17 DIAGNOSIS — R3912 Poor urinary stream: Secondary | ICD-10-CM

## 2021-05-17 DIAGNOSIS — I10 Essential (primary) hypertension: Secondary | ICD-10-CM | POA: Diagnosis not present

## 2021-05-17 DIAGNOSIS — E782 Mixed hyperlipidemia: Secondary | ICD-10-CM | POA: Diagnosis not present

## 2021-05-17 MED ORDER — VITAMIN D (ERGOCALCIFEROL) 1.25 MG (50000 UNIT) PO CAPS: ORAL_CAPSULE | ORAL | 1 refills | Status: DC

## 2021-05-17 MED ORDER — AMLODIPINE BESYLATE 5 MG PO TABS: 5.0000 mg | ORAL_TABLET | Freq: Every day | ORAL | 1 refills | Status: DC

## 2021-05-17 MED ORDER — DUTASTERIDE 0.5 MG PO CAPS: ORAL_CAPSULE | ORAL | 1 refills | Status: DC

## 2021-05-17 MED ORDER — ATORVASTATIN CALCIUM 10 MG PO TABS: ORAL_TABLET | ORAL | 1 refills | Status: DC

## 2021-05-17 NOTE — Progress Notes (Signed)
Date:  05/17/2021   Name:  Victor Walsh   DOB:  03/21/1936   MRN:  [id number]   Chief Complaint: vitamin d deficiency, Hyperlipidemia, and Hypertension  Hyperlipidemia This is a chronic problem. The current episode started more than 1 year ago. The problem is controlled. Recent lipid tests were reviewed and are normal. He has no history of chronic renal disease, diabetes, hypothyroidism, liver disease, obesity or nephrotic syndrome. Pertinent negatives include no chest pain, focal sensory loss, focal weakness, leg pain, myalgias or shortness of breath. Current antihyperlipidemic treatment includes statins. The current treatment provides moderate improvement of lipids. There are no compliance problems.  Risk factors for coronary artery disease include hypertension and dyslipidemia.  Hypertension This is a chronic problem. The current episode started more than 1 year ago. The problem has been gradually improving since onset. The problem is controlled. Pertinent negatives include no chest pain, headaches, neck pain, palpitations or shortness of breath. There are no associated agents to hypertension. Past treatments include calcium channel blockers. The current treatment provides moderate improvement. There are no compliance problems.  There is no history of angina, kidney disease, CAD/MI, CVA, heart failure, left ventricular hypertrophy, PVD or retinopathy. There is no history of chronic renal disease, a hypertension causing med or renovascular disease.  Benign Prostatic Hypertrophy This is a chronic problem. The problem is unchanged. Irritative symptoms include nocturia. Irritative symptoms do not include frequency or urgency. Obstructive symptoms do not include incomplete emptying, a slower stream or a weak stream. Pertinent negatives include no chills, dysuria, hematuria or nausea. Past treatments include dutasteride. The treatment provided moderate relief.   Lab Results  Component Value Date    NA 139 10/05/2020   K 4.3 10/05/2020   CO2 22 10/05/2020   GLUCOSE 96 10/05/2020   BUN 15 10/05/2020   CREATININE 1.38 (H) 10/05/2020   CALCIUM 8.6 10/05/2020   EGFR 51 (L) 10/05/2020   GFRNONAA 51 (L) 04/14/2020   Lab Results  Component Value Date   CHOL 144 10/05/2020   HDL 55 10/05/2020   LDLCALC 79 10/05/2020   TRIG 45 10/05/2020   CHOLHDL 4.2 01/01/2019   No results found for: TSH No results found for: HGBA1C Lab Results  Component Value Date   WBC 5.0 10/18/2017   HGB 14.9 10/18/2017   HCT 44.8 10/18/2017   MCV 99 (H) 10/18/2017   PLT 156 10/18/2017   Lab Results  Component Value Date   ALT 15 04/14/2020   AST 17 04/14/2020   ALKPHOS 56 04/14/2020   BILITOT 0.3 04/14/2020   No results found for: 25OHVITD2, 25OHVITD3, VD25OH   Review of Systems  Constitutional:  Negative for chills and fever.  HENT:  Negative for drooling, ear discharge, ear pain and sore throat.   Respiratory:  Negative for cough, shortness of breath and wheezing.   Cardiovascular:  Negative for chest pain, palpitations and leg swelling.  Gastrointestinal:  Negative for abdominal pain, blood in stool, constipation, diarrhea and nausea.  Endocrine: Negative for polydipsia.  Genitourinary:  Positive for nocturia. Negative for dysuria, frequency, hematuria, incomplete emptying and urgency.  Musculoskeletal:  Negative for back pain, myalgias and neck pain.  Skin:  Negative for rash.  Allergic/Immunologic: Negative for environmental allergies.  Neurological:  Negative for dizziness, focal weakness and headaches.  Hematological:  Does not bruise/bleed easily.  Psychiatric/Behavioral:  Negative for suicidal ideas. The patient is not nervous/anxious.    Patient Active Problem List   Diagnosis Date Noted  Benign prostatic hyperplasia with weak urinary stream 07/02/2019   Chronic kidney disease (CKD), stage III (moderate) ([place]) 01/29/2019   Essential hypertension 02/09/2015   Hyperlipidemia type  III 02/09/2015   Benign prostatic hypertrophy 02/09/2015   Vitamin D deficiency 02/09/2015    No Known Allergies  Past Surgical History:  Procedure Laterality Date   COLONOSCOPY  2013   cleared- Dr Gustavo Lah   COLONOSCOPY N/A 08/22/2016   Procedure: COLONOSCOPY;  Surgeon: Lollie Sails, MD;  Location: [facility] ENDOSCOPY;  Service: Endoscopy;  Laterality: N/A;   COLONOSCOPY W/ POLYPECTOMY     HERNIA REPAIR      Social History   Tobacco Use   Smoking status: Never   Smokeless tobacco: Never   Tobacco comments:    smoking cessation materials not required  Vaping Use   Vaping Use: Never used  Substance Use Topics   Alcohol use: No    Alcohol/week: 0.0 standard drinks   Drug use: No     Medication list has been reviewed and updated.  Current Meds  Medication Sig   amLODipine (NORVASC) 5 MG tablet TAKE 1 TABLET BY MOUTH DAILY   aspirin EC 81 MG tablet Take by mouth.   atorvastatin (LIPITOR) 10 MG tablet TAKE 1 TABLET(10 MG) BY MOUTH DAILY   dutasteride (AVODART) 0.5 MG capsule TAKE 1 CAPSULE BY MOUTH DAILY   Vitamin D, Ergocalciferol, (DRISDOL) 1.25 MG (50000 UNIT) CAPS capsule TAKE 1 CAPSULE BY MOUTH EVERY 30 DAYS    PHQ 2/9 Scores 02/21/2021 10/05/2020 04/14/2020 01/14/2020  PHQ - 2 Score 0 0 0 0  PHQ- 9 Score - 0 0 -    GAD 7 : Generalized Anxiety Score 10/05/2020 04/14/2020 07/02/2019  Nervous, Anxious, on Edge 0 0 0  Control/stop worrying 0 0 0  Worry too much - different things 0 0 0  Trouble relaxing 0 0 0  Restless 0 0 0  Easily annoyed or irritable 0 0 0  Afraid - awful might happen 0 0 0  Total GAD 7 Score 0 0 0    BP Readings from Last 3 Encounters:  05/17/21 138/64  02/21/21 (!) 142/88  10/05/20 (!) 148/102    Physical Exam Vitals and nursing note reviewed.  HENT:     Head: Normocephalic.     Right Ear: External ear normal.     Left Ear: External ear normal.     Nose: Nose normal. No congestion or rhinorrhea.  Eyes:     General: No scleral  icterus.       Right eye: No discharge.        Left eye: No discharge.     Conjunctiva/sclera: Conjunctivae normal.     Pupils: Pupils are equal, round, and reactive to light.  Neck:     Thyroid: No thyromegaly.     Vascular: No JVD.     Trachea: No tracheal deviation.  Cardiovascular:     Rate and Rhythm: Normal rate and regular rhythm.     Heart sounds: Normal heart sounds. No murmur heard.   No friction rub. No gallop.  Pulmonary:     Effort: No respiratory distress.     Breath sounds: Normal breath sounds. No wheezing, rhonchi or rales.  Abdominal:     General: Bowel sounds are normal.     Palpations: Abdomen is soft. There is no mass.     Tenderness: There is no abdominal tenderness. There is no guarding or rebound.  Musculoskeletal:  General: No tenderness. Normal range of motion.     Cervical back: Normal range of motion and neck supple.  Lymphadenopathy:     Cervical: No cervical adenopathy.  Skin:    General: Skin is warm.     Findings: No rash.  Neurological:     Mental Status: He is alert and oriented to person, place, and time.     Cranial Nerves: No cranial nerve deficit.     Deep Tendon Reflexes: Reflexes are normal and symmetric.    Wt Readings from Last 3 Encounters:  05/17/21 193 lb (87.5 kg)  02/21/21 192 lb 9.6 oz (87.4 kg)  10/05/20 194 lb (88 kg)    BP 138/64    Pulse 80    Ht _0  (1.753 m)    Wt 193 lb (87.5 kg)    BMI 28.50 kg/m   Assessment and Plan:  1. Essential hypertension Chronic.  Controlled.  Stable.  Blood pressure 138/64.  Continue amlodipine 5 mg once a day.  Will check CMP for electrolytes and GFR. - amLODipine (NORVASC) 5 MG tablet; Take 1 tablet (5 mg total) by mouth daily.  Dispense: 90 tablet; Refill: 1 - Comprehensive Metabolic Panel (CMET)  2. Benign prostatic hyperplasia with weak urinary stream Chronic.  Controlled.  Stable.  Continue Avodart 0.5 mg once a day.. - dutasteride (AVODART) 0.5 MG capsule; TAKE 1  CAPSULE BY MOUTH DAILY  Dispense: 90 capsule; Refill: 1  3. Vitamin D deficiency Chronic.  Controlled.  Stable. - Vitamin D, Ergocalciferol, (DRISDOL) 1.25 MG (50000 UNIT) CAPS capsule; Take 1 pill every 30 days  Dispense: 3 capsule; Refill: 1  4. Hyperlipidemia type III Chronic.  Controlled.  Stable.  Continue atorvastatin 10 mg once a day.  Will check lipid panel to evaluate current level of control of LDL. - atorvastatin (LIPITOR) 10 MG tablet; TAKE 1 TABLET(10 MG) BY MOUTH DAILY  Dispense: 90 tablet; Refill: 1 - Lipid Panel With LDL/HDL Ratio

## 2021-05-18 LAB — COMPREHENSIVE METABOLIC PANEL
ALT: 21 IU/L (ref 0–44)
AST: 28 IU/L (ref 0–40)
Albumin/Globulin Ratio: 1.6 (ref 1.2–2.2)
Albumin: 4.4 g/dL (ref 3.6–4.6)
Alkaline Phosphatase: 62 IU/L (ref 44–121)
BUN/Creatinine Ratio: 14 (ref 10–24)
BUN: 19 mg/dL (ref 8–27)
Bilirubin Total: 0.4 mg/dL (ref 0.0–1.2)
CO2: 23 mmol/L (ref 20–29)
Calcium: 9.4 mg/dL (ref 8.6–10.2)
Chloride: 106 mmol/L (ref 96–106)
Creatinine, Ser: 1.35 mg/dL — ABNORMAL HIGH (ref 0.76–1.27)
Globulin, Total: 2.8 g/dL (ref 1.5–4.5)
Glucose: 92 mg/dL (ref 70–99)
Potassium: 4.2 mmol/L (ref 3.5–5.2)
Sodium: 141 mmol/L (ref 134–144)
Total Protein: 7.2 g/dL (ref 6.0–8.5)
eGFR: 52 mL/min/{1.73_m2} — ABNORMAL LOW (ref >59)

## 2021-05-18 LAB — LIPID PANEL WITH LDL/HDL RATIO
Cholesterol, Total: 161 mg/dL (ref 100–199)
HDL: 53 mg/dL (ref >39)
LDL Chol Calc (NIH): 90 mg/dL (ref 0–99)
LDL/HDL Ratio: 1.7 ratio (ref 0.0–3.6)
Triglycerides: 95 mg/dL (ref 0–149)
VLDL Cholesterol Cal: 18 mg/dL (ref 5–40)

## 2021-06-21 ENCOUNTER — Telehealth: Payer: Self-pay

## 2021-06-21 NOTE — Telephone Encounter (Signed)
Copied from [place] [id number]. Topic: General - Other >> Jun 21, 2021  1:25 PM Pawlus, Apolonio Schneiders wrote: Pts dentist office called requesting a list of active medications, pt is currently at his dentist office and requesting this be faxed to: [phone]

## 2021-11-15 ENCOUNTER — Ambulatory Visit: Payer: Medicare PPO | Admitting: Family Medicine

## 2021-11-16 ENCOUNTER — Ambulatory Visit: Payer: Medicare PPO | Admitting: Family Medicine

## 2021-11-16 ENCOUNTER — Encounter: Payer: Self-pay | Admitting: Family Medicine

## 2021-11-16 VITALS — BP 128/80 | HR 76 | Ht 69.0 in | Wt 187.0 lb

## 2021-11-16 DIAGNOSIS — R3912 Poor urinary stream: Secondary | ICD-10-CM

## 2021-11-16 DIAGNOSIS — N401 Enlarged prostate with lower urinary tract symptoms: Secondary | ICD-10-CM | POA: Diagnosis not present

## 2021-11-16 DIAGNOSIS — I10 Essential (primary) hypertension: Secondary | ICD-10-CM | POA: Diagnosis not present

## 2021-11-16 DIAGNOSIS — E782 Mixed hyperlipidemia: Secondary | ICD-10-CM | POA: Diagnosis not present

## 2021-11-16 MED ORDER — AMLODIPINE BESYLATE 5 MG PO TABS: 5.0000 mg | ORAL_TABLET | Freq: Every day | ORAL | 1 refills | Status: DC

## 2021-11-16 MED ORDER — DUTASTERIDE 0.5 MG PO CAPS: ORAL_CAPSULE | ORAL | 1 refills | Status: DC

## 2021-11-16 MED ORDER — ATORVASTATIN CALCIUM 10 MG PO TABS: ORAL_TABLET | ORAL | 1 refills | Status: DC

## 2021-11-16 NOTE — Progress Notes (Signed)
Date:  11/16/2021   Name:  Victor Walsh   DOB:  11-04-1935   MRN:  [id number]   Chief Complaint: Hypertension, Hyperlipidemia, and Benign Prostatic Hypertrophy  Hypertension This is a chronic problem. The current episode started more than 1 year ago. The problem has been gradually improving since onset. The problem is controlled. Pertinent negatives include no blurred vision, chest pain, headaches, orthopnea, palpitations, peripheral edema or shortness of breath. Risk factors for coronary artery disease include dyslipidemia. Past treatments include calcium channel blockers. The current treatment provides moderate improvement. There are no compliance problems.  There is no history of angina, kidney disease, CAD/MI, CVA, heart failure, left ventricular hypertrophy, PVD or retinopathy. There is no history of chronic renal disease, a hypertension causing med or renovascular disease.  Hyperlipidemia This is a chronic problem. The current episode started more than 1 year ago. The problem is controlled. Recent lipid tests were reviewed and are normal. He has no history of chronic renal disease, diabetes, hypothyroidism, liver disease, obesity or nephrotic syndrome. Pertinent negatives include no chest pain or shortness of breath. Current antihyperlipidemic treatment includes statins. The current treatment provides moderate improvement of lipids. There are no compliance problems.  Risk factors for coronary artery disease include dyslipidemia and hypertension.  Benign Prostatic Hypertrophy This is a chronic problem. The problem has been gradually improving since onset. Irritative symptoms do not include frequency, nocturia or urgency. Obstructive symptoms do not include dribbling, a slower stream or a weak stream. Past treatments include dutasteride. The treatment provided moderate relief.    Lab Results  Component Value Date   NA 141 05/17/2021   K 4.2 05/17/2021   CO2 23 05/17/2021   GLUCOSE 92  05/17/2021   BUN 19 05/17/2021   CREATININE 1.35 (H) 05/17/2021   CALCIUM 9.4 05/17/2021   EGFR 52 (L) 05/17/2021   GFRNONAA 51 (L) 04/14/2020   Lab Results  Component Value Date   CHOL 161 05/17/2021   HDL 53 05/17/2021   LDLCALC 90 05/17/2021   TRIG 95 05/17/2021   CHOLHDL 4.2 01/01/2019   No results found for: "TSH" No results found for: "HGBA1C" Lab Results  Component Value Date   WBC 5.0 10/18/2017   HGB 14.9 10/18/2017   HCT 44.8 10/18/2017   MCV 99 (H) 10/18/2017   PLT 156 10/18/2017   Lab Results  Component Value Date   ALT 21 05/17/2021   AST 28 05/17/2021   ALKPHOS 62 05/17/2021   BILITOT 0.4 05/17/2021   No results found for: "25OHVITD2", "25OHVITD3", "VD25OH"   Review of Systems  Eyes:  Negative for blurred vision.  Respiratory:  Negative for cough, shortness of breath and wheezing.   Cardiovascular:  Negative for chest pain, palpitations, orthopnea and leg swelling.  Genitourinary:  Negative for frequency, nocturia and urgency.  Neurological:  Negative for headaches.    Patient Active Problem List   Diagnosis Date Noted   Benign prostatic hyperplasia with weak urinary stream 07/02/2019   Chronic kidney disease (CKD), stage III (moderate) ([place]) 01/29/2019   Essential hypertension 02/09/2015   Hyperlipidemia type III 02/09/2015   Benign prostatic hypertrophy 02/09/2015   Vitamin D deficiency 02/09/2015    No Known Allergies  Past Surgical History:  Procedure Laterality Date   COLONOSCOPY  2013   cleared- Dr Gustavo Lah   COLONOSCOPY N/A 08/22/2016   Procedure: COLONOSCOPY;  Surgeon: Lollie Sails, MD;  Location: [facility] ENDOSCOPY;  Service: Endoscopy;  Laterality: N/A;   COLONOSCOPY W/ POLYPECTOMY  HERNIA REPAIR      Social History   Tobacco Use   Smoking status: Never   Smokeless tobacco: Never   Tobacco comments:    smoking cessation materials not required  Vaping Use   Vaping Use: Never used  Substance Use Topics   Alcohol use: No     Alcohol/week: 0.0 standard drinks of alcohol   Drug use: No     Medication list has been reviewed and updated.  Current Meds  Medication Sig   amLODipine (NORVASC) 5 MG tablet Take 1 tablet (5 mg total) by mouth daily.   aspirin EC 81 MG tablet Take by mouth.   atorvastatin (LIPITOR) 10 MG tablet TAKE 1 TABLET(10 MG) BY MOUTH DAILY   dutasteride (AVODART) 0.5 MG capsule TAKE 1 CAPSULE BY MOUTH DAILY   Vitamin D, Ergocalciferol, (DRISDOL) 1.25 MG (50000 UNIT) CAPS capsule Take 1 pill every 30 days       11/16/2021    8:32 AM 10/05/2020    2:16 PM 04/14/2020    9:23 AM 07/02/2019   10:27 AM  GAD 7 : Generalized Anxiety Score  Nervous, Anxious, on Edge 0 0 0 0  Control/stop worrying 0 0 0 0  Worry too much - different things 0 0 0 0  Trouble relaxing 0 0 0 0  Restless 0 0 0 0  Easily annoyed or irritable 0 0 0 0  Afraid - awful might happen 0 0 0 0  Total GAD 7 Score 0 0 0 0  Anxiety Difficulty Not difficult at all          11/16/2021    8:32 AM  Depression screen PHQ 2/9  Decreased Interest 0  Down, Depressed, Hopeless 0  PHQ - 2 Score 0  Altered sleeping 0  Tired, decreased energy 0  Change in appetite 0  Feeling bad or failure about yourself  0  Trouble concentrating 0  Moving slowly or fidgety/restless 0  Suicidal thoughts 0  PHQ-9 Score 0  Difficult doing work/chores Not difficult at all    BP Readings from Last 3 Encounters:  11/16/21 128/80  05/17/21 138/64  02/21/21 (!) 142/88    Physical Exam Vitals and nursing note reviewed.  HENT:     Head: Normocephalic.     Right Ear: External ear normal.     Left Ear: External ear normal.     Nose: Nose normal.  Eyes:     General: No scleral icterus.       Right eye: No discharge.        Left eye: No discharge.     Conjunctiva/sclera: Conjunctivae normal.     Pupils: Pupils are equal, round, and reactive to light.  Neck:     Thyroid: No thyromegaly.     Vascular: No JVD.     Trachea: No tracheal  deviation.  Cardiovascular:     Rate and Rhythm: Normal rate and regular rhythm.     Heart sounds: Normal heart sounds. No murmur heard.    No friction rub. No gallop.  Pulmonary:     Effort: No respiratory distress.     Breath sounds: Normal breath sounds. No wheezing or rales.  Abdominal:     General: Bowel sounds are normal.     Palpations: Abdomen is soft. There is no mass.     Tenderness: There is no abdominal tenderness. There is no guarding or rebound.  Genitourinary:    Prostate: Enlarged. Not tender and no nodules present.  Rectum: Normal. Guaiac result negative. No mass.  Musculoskeletal:        General: No tenderness. Normal range of motion.     Cervical back: Normal range of motion and neck supple.  Lymphadenopathy:     Cervical: No cervical adenopathy.  Skin:    General: Skin is warm.     Findings: No rash.  Neurological:     Mental Status: He is alert and oriented to person, place, and time.     Cranial Nerves: No cranial nerve deficit.     Deep Tendon Reflexes: Reflexes are normal and symmetric.     Wt Readings from Last 3 Encounters:  11/16/21 187 lb (84.8 kg)  05/17/21 193 lb (87.5 kg)  02/21/21 192 lb 9.6 oz (87.4 kg)    BP 128/80   Pulse 76   Ht _0  (1.753 m)   Wt 187 lb (84.8 kg)   BMI 27.62 kg/m   Assessment and Plan:  1. Essential hypertension Chronic.  Controlled.  Stable.  Blood pressure today 128/80.  Continue amlodipine 5 mg once a day.  Will check renal function panel for electrolytes and GFR. - amLODipine (NORVASC) 5 MG tablet; Take 1 tablet (5 mg total) by mouth daily.  Dispense: 90 tablet; Refill: 1 - Renal Function Panel  2. Hyperlipidemia type III Chronic.  Controlled.  Stable.  Continue atorvastatin 10 mg once a day. - atorvastatin (LIPITOR) 10 MG tablet; TAKE 1 TABLET(10 MG) BY MOUTH DAILY  Dispense: 90 tablet; Refill: 1  3. Benign prostatic hyperplasia with weak urinary stream Chronic.  Controlled.  Stable.  Patient is  asymptomatic.  We will continue long-term use of Avodart 0.5 mg once a day.  We will recheck PSA for current status.  DRE only notes mild enlargement with no nodularity or change in consistency. - dutasteride (AVODART) 0.5 MG capsule; TAKE 1 CAPSULE BY MOUTH DAILY  Dispense: 90 capsule; Refill: 1 - PSA

## 2021-11-17 LAB — RENAL FUNCTION PANEL
Albumin: 4.1 g/dL (ref 3.6–4.6)
BUN/Creatinine Ratio: 11 (ref 10–24)
BUN: 17 mg/dL (ref 8–27)
CO2: 21 mmol/L (ref 20–29)
Calcium: 9.1 mg/dL (ref 8.6–10.2)
Chloride: 104 mmol/L (ref 96–106)
Creatinine, Ser: 1.59 mg/dL — ABNORMAL HIGH (ref 0.76–1.27)
Glucose: 105 mg/dL — ABNORMAL HIGH (ref 70–99)
Phosphorus: 2.4 mg/dL — ABNORMAL LOW (ref 2.8–4.1)
Potassium: 4.1 mmol/L (ref 3.5–5.2)
Sodium: 140 mmol/L (ref 134–144)
eGFR: 43 mL/min/{1.73_m2} — ABNORMAL LOW (ref >59)

## 2021-11-17 LAB — PSA: Prostate Specific Ag, Serum: 2.1 ng/mL (ref 0.0–4.0)

## 2021-11-18 ENCOUNTER — Ambulatory Visit: Payer: Medicare PPO | Admitting: Family Medicine

## 2021-12-01 ENCOUNTER — Ambulatory Visit: Payer: Medicare PPO | Admitting: Family Medicine

## 2021-12-22 ENCOUNTER — Other Ambulatory Visit: Payer: Self-pay

## 2021-12-22 DIAGNOSIS — E559 Vitamin D deficiency, unspecified: Secondary | ICD-10-CM

## 2021-12-22 MED ORDER — VITAMIN D (ERGOCALCIFEROL) 1.25 MG (50000 UNIT) PO CAPS: ORAL_CAPSULE | ORAL | 1 refills | Status: DC

## 2022-02-22 ENCOUNTER — Ambulatory Visit: Payer: Medicare PPO

## 2022-03-07 NOTE — Patient Instructions (Signed)
Health Maintenance, Male Adopting a healthy lifestyle and getting preventive care are important in promoting health and wellness. Ask your health care provider about: The right schedule for you to have regular tests and exams. Things you can do on your own to prevent diseases and keep yourself healthy. What should I know about diet, weight, and exercise? Eat a healthy diet  Eat a diet that includes plenty of vegetables, fruits, low-fat dairy products, and lean protein. Do not eat a lot of foods that are high in solid fats, added sugars, or sodium. Maintain a healthy weight Body mass index (BMI) is a measurement that can be used to identify possible weight problems. It estimates body fat based on height and weight. Your health care provider can help determine your BMI and help you achieve or maintain a healthy weight. Get regular exercise Get regular exercise. This is one of the most important things you can do for your health. Most adults should: Exercise for at least 150 minutes each week. The exercise should increase your heart rate and make you sweat (moderate-intensity exercise). Do strengthening exercises at least twice a week. This is in addition to the moderate-intensity exercise. Spend less time sitting. Even light physical activity can be beneficial. Watch cholesterol and blood lipids Have your blood tested for lipids and cholesterol at 84 years of age, then have this test every 5 years. You may need to have your cholesterol levels checked more often if: Your lipid or cholesterol levels are high. You are older than 84 years of age. You are at high risk for heart disease. What should I know about cancer screening? Many types of cancers can be detected early and may often be prevented. Depending on your health history and family history, you may need to have cancer screening at various ages. This may include screening for: Colorectal cancer. Prostate cancer. Skin cancer. Lung  cancer. What should I know about heart disease, diabetes, and high blood pressure? Blood pressure and heart disease High blood pressure causes heart disease and increases the risk of stroke. This is more likely to develop in people who have high blood pressure readings or are overweight. Talk with your health care provider about your target blood pressure readings. Have your blood pressure checked: Every 3-5 years if you are 18-39 years of age. Every year if you are 40 years old or older. If you are between the ages of 65 and 75 and are a current or former smoker, ask your health care provider if you should have a one-time screening for abdominal aortic aneurysm (AAA). Diabetes Have regular diabetes screenings. This checks your fasting blood sugar level. Have the screening done: Once every three years after age 45 if you are at a normal weight and have a low risk for diabetes. More often and at a younger age if you are overweight or have a high risk for diabetes. What should I know about preventing infection? Hepatitis B If you have a higher risk for hepatitis B, you should be screened for this virus. Talk with your health care provider to find out if you are at risk for hepatitis B infection. Hepatitis C Blood testing is recommended for: Everyone born from 1945 through 1965. Anyone with known risk factors for hepatitis C. Sexually transmitted infections (STIs) You should be screened each year for STIs, including gonorrhea and chlamydia, if: You are sexually active and are younger than 84 years of age. You are older than 84 years of age and your   health care provider tells you that you are at risk for this type of infection. Your sexual activity has changed since you were last screened, and you are at increased risk for chlamydia or gonorrhea. Ask your health care provider if you are at risk. Ask your health care provider about whether you are at high risk for HIV. Your health care provider  may recommend a prescription medicine to help prevent HIV infection. If you choose to take medicine to prevent HIV, you should first get tested for HIV. You should then be tested every 3 months for as long as you are taking the medicine. Follow these instructions at home: Alcohol use Do not drink alcohol if your health care provider tells you not to drink. If you drink alcohol: Limit how much you have to 0-2 drinks a day. Know how much alcohol is in your drink. In the [place] one drink equals one 12 oz bottle of beer (355 mL), one 5 oz glass of wine (148 mL), or one 1 oz glass of hard liquor (44 mL). Lifestyle Do not use any products that contain nicotine or tobacco. These products include cigarettes, chewing tobacco, and vaping devices, such as e-cigarettes. If you need help quitting, ask your health care provider. Do not use street drugs. Do not share needles. Ask your health care provider for help if you need support or information about quitting drugs. General instructions Schedule regular health, dental, and eye exams. Stay current with your vaccines. Tell your health care provider if: You often feel depressed. You have ever been abused or do not feel safe at home. Summary Adopting a healthy lifestyle and getting preventive care are important in promoting health and wellness. Follow your health care provider's instructions about healthy diet, exercising, and getting tested or screened for diseases. Follow your health care provider's instructions on monitoring your cholesterol and blood pressure. This information is not intended to replace advice given to you by your health care provider. Make sure you discuss any questions you have with your health care provider. Document Revised: 09/27/2020 Document Reviewed: 09/27/2020 Elsevier Patient Education  2023 [employer]  

## 2022-03-07 NOTE — Progress Notes (Unsigned)
  The pt did not show up for the appt.

## 2022-03-08 ENCOUNTER — Ambulatory Visit (INDEPENDENT_AMBULATORY_CARE_PROVIDER_SITE_OTHER): Payer: Medicare PPO

## 2022-03-08 DIAGNOSIS — Z91199 Patient's noncompliance with other medical treatment and regimen due to unspecified reason: Secondary | ICD-10-CM

## 2022-03-17 ENCOUNTER — Ambulatory Visit (INDEPENDENT_AMBULATORY_CARE_PROVIDER_SITE_OTHER): Payer: Medicare PPO

## 2022-03-17 ENCOUNTER — Telehealth: Payer: Self-pay

## 2022-03-17 VITALS — BP 136/86 | HR 67 | Temp 98.5°F | Resp 18 | Ht 69.0 in | Wt 190.4 lb

## 2022-03-17 DIAGNOSIS — Z0000 Encounter for general adult medical examination without abnormal findings: Secondary | ICD-10-CM

## 2022-03-17 NOTE — Progress Notes (Signed)
I connected with  [name] on 03/17/22 by a audio enabled telemedicine application and verified that I am speaking with the correct person using two identifiers.  Patient Location: Home  Provider Location: Office/Clinic  I discussed the limitations of evaluation and management by telemedicine. The patient expressed understanding and agreed to proceed.   Subjective:   Victor Walsh is a 86 y.o. male who presents for Medicare Annual/Subsequent preventive examination.  Review of Systems    Cardiac Risk Factors include: advanced age (>36mn, >>18women);male gender        Objective:    Today's Vitals   03/17/22 1416 03/17/22 1444  BP: (!) 146/88 136/86  Pulse: 67   Resp: 18   Temp: 98.5 F (36.9 C)   TempSrc: Oral   SpO2: 95%   Weight: 190 lb 6.4 oz (86.4 kg)   Height: '5\' 9"'$  (1.753 m)   PainSc: 0-No pain    Body mass index is 28.12 kg/m.     02/21/2021    3:27 PM 01/14/2020    2:52 PM 01/01/2019    3:57 PM 09/26/2017    3:44 PM 08/22/2016    6:42 AM 02/09/2015    3:10 PM  Advanced Directives  Does Patient Have a Medical Advance Directive? No No No No No Yes  Type of Advance Directive      Living will;Healthcare [place]?      No - copy requested  Would patient like information on creating a medical advance directive? No - Patient declined No - Patient declined No - Patient declined Yes (MAU/Ambulatory/Procedural Areas - Information given)      Current Medications (verified) Outpatient Encounter Medications as of 03/17/2022  Medication Sig   amLODipine (NORVASC) 5 MG tablet Take 1 tablet (5 mg total) by mouth daily.   aspirin EC 81 MG tablet Take by mouth.   atorvastatin (LIPITOR) 10 MG tablet TAKE 1 TABLET(10 MG) BY MOUTH DAILY   dutasteride (AVODART) 0.5 MG capsule TAKE 1 CAPSULE BY MOUTH DAILY   Vitamin D, Ergocalciferol, (DRISDOL) 1.25 MG (50000 UNIT) CAPS capsule Take 1 pill every 30 days   No  facility-administered encounter medications on file as of 03/17/2022.    Allergies (verified) Patient has no known allergies.   History: Past Medical History:  Diagnosis Date   BPH (benign prostatic hyperplasia)    Chronic kidney disease    Hyperlipidemia    Hypertension    Stomach ulcer    Past Surgical History:  Procedure Laterality Date   COLONOSCOPY  2013   cleared- Dr SGustavo Lah  COLONOSCOPY N/A 08/22/2016   Procedure: COLONOSCOPY;  Surgeon: MLollie Sails MD;  [facility];  Service: Endoscopy;  Laterality: N/A;   COLONOSCOPY W/ POLYPECTOMY     HERNIA REPAIR     Family History  Problem Relation Age of Onset   Kidney disease Mother    Cancer Father        stomach cancer   Social History   Socioeconomic History   Marital status: Divorced    Spouse name: Not on file   Number of children: 1   Years of education: Not on file   Highest education level: Master's degree (e.g., MA, MS, MEng, MEd, MSW, MBA)  Occupational History    Employer: [employer]  Tobacco Use   Smoking status: Never   Smokeless tobacco: Never   Tobacco comments:    smoking cessation  materials not required  Vaping Use   Vaping Use: Never used  Substance and Sexual Activity   Alcohol use: No    Alcohol/week: 0.0 standard drinks of alcohol   Drug use: No   Sexual activity: Never  Other Topics Concern   Not on file  Social History Narrative   Pt lives alone   Social Determinants of Health   Financial Resource Strain: Low Risk  (03/17/2022)   Overall Financial Resource Strain (CARDIA)    Difficulty of Paying Living Expenses: Not hard at all  Food Insecurity: No Food Insecurity (03/17/2022)   Hunger Vital Sign    Worried About Running Out of Food in the Last Year: Never true    Ran Out of Food in the Last Year: Never true  Transportation Needs: No Transportation Needs (03/17/2022)   PRAPARE - Hydrologist (Medical): No    Lack of  Transportation (Non-Medical): No  Physical Activity: Insufficiently Active (03/17/2022)   Exercise Vital Sign    Days of Exercise per Week: 1 day    Minutes of Exercise per Session: 30 min  Stress: No Stress Concern Present (03/17/2022)   [place]    Feeling of Stress : Not at all  Social Connections: Socially Isolated (03/17/2022)   Social Connection and Isolation Panel [NHANES]    Frequency of Communication with Friends and Family: Once a week    Frequency of Social Gatherings with Friends and Family: More than three times a week    Attends Religious Services: Never    Marine scientist or Organizations: No    Attends Music therapist: Never    Marital Status: Divorced    Tobacco Counseling Counseling given: No Tobacco comments: smoking cessation materials not required   Clinical Intake:     Pain Score: 0-No pain     Nutritional Status: BMI 25 -29 Overweight Nutritional Risks: None Diabetes: No  How often do you need to have someone help you when you read instructions, pamphlets, or other written materials from your doctor or pharmacy?: 1 - Never  Diabetic?No   Interpreter Needed?: No  Information entered by :: Donnie Mesa, CMA   Activities of Daily Living    03/17/2022    2:41 PM  In your present state of health, do you have any difficulty performing the following activities:  Hearing? 1  Comment tinnutis Right ear  Vision? 1  Comment Eye exam, [place]  Difficulty concentrating or making decisions? 0  Walking or climbing stairs? 0  Dressing or bathing? 0  Doing errands, shopping? 0    Patient Care Team: Juline Patch, MD as PCP - General (Family Medicine) Lollie Sails, MD (Inactive) as Consulting Physician (Gastroenterology)  Indicate any recent Medical Services you may have received from other than [facility] providers in the past year (date may be  approximate). No hospitalatizion    Assessment:   This is a routine wellness examination for Victor Walsh.  Hearing/Vision screen Denies any hearing issues, but admits to some tinnitus in the Rt ear. Denies any vision issues, wear glasses. [facility] Exam at [facility]   Dietary issues and exercise activities discussed:     Goals Addressed   None    Depression Screen    03/17/2022    2:25 PM 11/16/2021    8:32 AM 02/21/2021    3:24 PM 10/05/2020    2:16 PM 04/14/2020  9:23 AM 01/14/2020    2:50 PM 07/02/2019   10:27 AM  PHQ 2/9 Scores  PHQ - 2 Score 0 0 0 0 0 0 0  PHQ- 9 Score 0 0  0 0  0    Fall Risk    03/17/2022    2:20 PM 02/21/2021    3:30 PM 04/14/2020    9:23 AM 01/14/2020    2:53 PM 07/02/2019   10:26 AM  Fall Risk   Falls in the past year? 0 0 0 0 0  Number falls in past yr: 0 0  0   Injury with Fall? 0 0  0   Risk for fall due to : No Fall Risks No Fall Risks  No Fall Risks   Follow up Falls evaluation completed Falls prevention discussed Falls evaluation completed Falls prevention discussed Falls evaluation completed    FALL RISK PREVENTION PERTAINING TO THE HOME:  Any stairs in or around the home? Yes  If so, are there any without handrails? No  Home free of loose throw rugs in walkways, pet beds, electrical cords, etc? Yes  Adequate lighting in your home to reduce risk of falls? Yes   ASSISTIVE DEVICES UTILIZED TO PREVENT FALLS:  Life alert? No  Use of a cane, walker or w/c? No  Grab bars in the bathroom? Yes  Shower chair or bench in shower? Yes  Elevated toilet seat or a handicapped toilet? No   TIMED UP AND GO:  Was the test performed?  yes .  Length of time to ambulate 10 feet: 10 sec.   Gait steady and fast without use of assistive device  Cognitive Function:        03/17/2022    2:29 PM 04/14/2020    9:20 AM 01/14/2020    2:59 PM 01/01/2019    4:04 PM 09/26/2017    3:38 PM  6CIT Screen  What Year? 0 points 0 points 0 points 0  points 0 points  What month? 0 points 0 points 0 points 0 points 0 points  What time? 0 points 0 points 0 points 0 points 0 points  Count back from 20 0 points 0 points 0 points 0 points 0 points  Months in reverse 0 points 0 points 0 points 0 points 0 points  Repeat phrase 0 points 0 points 0 points 0 points 2 points  Total Score 0 points 0 points 0 points 0 points 2 points    Immunizations Immunization History  Administered Date(s) Administered   COVID-19, mRNA, vaccine(Comirnaty)12 years and older 02/24/2022   Influenza Split 02/09/2015   Influenza, High Dose Seasonal PF 02/21/2017, 02/04/2018, 02/22/2022   Influenza,inj,Quad PF,6+ Mos 02/09/2015   Influenza-Unspecified 01/21/2019, 01/21/2020, 01/24/2021   Moderna SARS-COV2 Booster Vaccination 10/13/2020   PFIZER(Purple Top)SARS-COV-2 Vaccination 06/26/2019, 07/17/2019, 03/23/2020   PNEUMOCOCCAL CONJUGATE-20 10/05/2020   Pneumococcal Conjugate-13 02/09/2015   Pneumococcal Polysaccharide-23 05/22/2006   Tdap 09/14/2015    TDAP status: Up to date  Flu Vaccine status: Up to date  Pneumococcal vaccine status: Up to date  Covid-19 vaccine status: Information provided on how to obtain vaccines.   Qualifies for Shingles Vaccine? Yes   Zostavax completed No   Shingrix Completed?: No.    Education has been provided regarding the importance of this vaccine. Patient has been advised to call insurance company to determine out of pocket expense if they have not yet received this vaccine. Advised may also receive vaccine at local pharmacy or Health Dept. Verbalized acceptance and  understanding.  Screening Tests Health Maintenance  Topic Date Due   Zoster Vaccines- Shingrix (1 of 2) Never done   COVID-19 Vaccine (4 - Pfizer series) 12/08/2020   Medicare Annual Wellness (AWV)  03/18/2023   TETANUS/TDAP  09/13/2025   Pneumonia Vaccine 50+ Years old  Completed   INFLUENZA VACCINE  Completed   HPV VACCINES  Aged Out    Health  Maintenance  Health Maintenance Due  Topic Date Due   Zoster Vaccines- Shingrix (1 of 2) Never done   COVID-19 Vaccine (4 - Pfizer series) 12/08/2020    Colorectal cancer screening: No longer required.   Lung Cancer Screening: (Low Dose CT Chest recommended if Age 55-80 years, 30 pack-year currently smoking OR have quit w/in 15years.) does not qualify.   Lung Cancer Screening Referral:   Additional Screening:  Hepatitis C Screening: does not qualify  Vision Screening: Recommended annual ophthalmology exams for early detection of glaucoma and other disorders of the eye. Is the patient up to date with their annual eye exam?  Yes  Who is the provider or what is the name of the office in which the patient attends annual eye exams?  If pt is not established with a provider, would they like to be referred to a provider to establish care? No .   Dental Screening: Recommended annual dental exams for proper oral hygiene  Community Resource Referral / Chronic Care Management: CRR required this visit?  No   CCM required this visit?  No      Plan:     I have personally reviewed and noted the following in the patient's chart:   Medical and social history Use of alcohol, tobacco or illicit drugs  Current medications and supplements including opioid prescriptions. Patient is not currently taking opioid prescriptions. Functional ability and status Nutritional status Physical activity Advanced directives List of other physicians Hospitalizations, surgeries, and ER visits in previous 12 months Vitals Screenings to include cognitive, depression, and falls Referrals and appointments  In addition, I have reviewed and discussed with patient certain preventive protocols, quality metrics, and best practice recommendations. A written personalized care plan for preventive services as well as general preventive health recommendations were provided to patient.    Victor Walsh , Thank you for  taking time to come for your Medicare Wellness Visit. I appreciate your ongoing commitment to your health goals. Please review the following plan we discussed and let me know if I can assist you in the future.   These are the goals we discussed:  Goals      DIET - INCREASE WATER INTAKE     Recommend to drink at least 6-8 8oz glasses of water per day.        This is a list of the screening recommended for you and due dates:  Health Maintenance  Topic Date Due   Zoster (Shingles) Vaccine (1 of 2) Never done   COVID-19 Vaccine (4 - Pfizer series) 12/08/2020   Medicare Annual Wellness Visit  03/18/2023   Tetanus Vaccine  09/13/2025   Pneumonia Vaccine  Completed   Flu Shot  Completed   HPV Vaccine  Aged [address] [place], [place]   03/17/2022   Nurse Notes: Approximately 30-minute Non-Face-To-Face Visit. The pt complains of several episodes of  intermittent roaring sound in his right ear and heart flutter. Notice mostly at rest, and denies any chest pains, numbness, or tingling. He also denies any abnormal symptoms today.  I  recommended scheduling an appt with the PCP for the concerns. He denied scheduling an sooner appt, stating he has not felt it lately. He did schedule an appt in December. I informed him if his symptoms return or worsening to seek medical care.

## 2022-03-17 NOTE — Patient Instructions (Signed)
Health Maintenance, Male Adopting a healthy lifestyle and getting preventive care are important in promoting health and wellness. Ask your health care provider about: The right schedule for you to have regular tests and exams. Things you can do on your own to prevent diseases and keep yourself healthy. What should I know about diet, weight, and exercise? Eat a healthy diet  Eat a diet that includes plenty of vegetables, fruits, low-fat dairy products, and lean protein. Do not eat a lot of foods that are high in solid fats, added sugars, or sodium. Maintain a healthy weight Body mass index (BMI) is a measurement that can be used to identify possible weight problems. It estimates body fat based on height and weight. Your health care provider can help determine your BMI and help you achieve or maintain a healthy weight. Get regular exercise Get regular exercise. This is one of the most important things you can do for your health. Most adults should: Exercise for at least 150 minutes each week. The exercise should increase your heart rate and make you sweat (moderate-intensity exercise). Do strengthening exercises at least twice a week. This is in addition to the moderate-intensity exercise. Spend less time sitting. Even light physical activity can be beneficial. Watch cholesterol and blood lipids Have your blood tested for lipids and cholesterol at 84 years of age, then have this test every 5 years. You may need to have your cholesterol levels checked more often if: Your lipid or cholesterol levels are high. You are older than 84 years of age. You are at high risk for heart disease. What should I know about cancer screening? Many types of cancers can be detected early and may often be prevented. Depending on your health history and family history, you may need to have cancer screening at various ages. This may include screening for: Colorectal cancer. Prostate cancer. Skin cancer. Lung  cancer. What should I know about heart disease, diabetes, and high blood pressure? Blood pressure and heart disease High blood pressure causes heart disease and increases the risk of stroke. This is more likely to develop in people who have high blood pressure readings or are overweight. Talk with your health care provider about your target blood pressure readings. Have your blood pressure checked: Every 3-5 years if you are 18-39 years of age. Every year if you are 40 years old or older. If you are between the ages of 65 and 75 and are a current or former smoker, ask your health care provider if you should have a one-time screening for abdominal aortic aneurysm (AAA). Diabetes Have regular diabetes screenings. This checks your fasting blood sugar level. Have the screening done: Once every three years after age 45 if you are at a normal weight and have a low risk for diabetes. More often and at a younger age if you are overweight or have a high risk for diabetes. What should I know about preventing infection? Hepatitis B If you have a higher risk for hepatitis B, you should be screened for this virus. Talk with your health care provider to find out if you are at risk for hepatitis B infection. Hepatitis C Blood testing is recommended for: Everyone born from 1945 through 1965. Anyone with known risk factors for hepatitis C. Sexually transmitted infections (STIs) You should be screened each year for STIs, including gonorrhea and chlamydia, if: You are sexually active and are younger than 84 years of age. You are older than 84 years of age and your   health care provider tells you that you are at risk for this type of infection. Your sexual activity has changed since you were last screened, and you are at increased risk for chlamydia or gonorrhea. Ask your health care provider if you are at risk. Ask your health care provider about whether you are at high risk for HIV. Your health care provider  may recommend a prescription medicine to help prevent HIV infection. If you choose to take medicine to prevent HIV, you should first get tested for HIV. You should then be tested every 3 months for as long as you are taking the medicine. Follow these instructions at home: Alcohol use Do not drink alcohol if your health care provider tells you not to drink. If you drink alcohol: Limit how much you have to 0-2 drinks a day. Know how much alcohol is in your drink. In the [place] one drink equals one 12 oz bottle of beer (355 mL), one 5 oz glass of wine (148 mL), or one 1 oz glass of hard liquor (44 mL). Lifestyle Do not use any products that contain nicotine or tobacco. These products include cigarettes, chewing tobacco, and vaping devices, such as e-cigarettes. If you need help quitting, ask your health care provider. Do not use street drugs. Do not share needles. Ask your health care provider for help if you need support or information about quitting drugs. General instructions Schedule regular health, dental, and eye exams. Stay current with your vaccines. Tell your health care provider if: You often feel depressed. You have ever been abused or do not feel safe at home. Summary Adopting a healthy lifestyle and getting preventive care are important in promoting health and wellness. Follow your health care provider's instructions about healthy diet, exercising, and getting tested or screened for diseases. Follow your health care provider's instructions on monitoring your cholesterol and blood pressure. This information is not intended to replace advice given to you by your health care provider. Make sure you discuss any questions you have with your health care provider. Document Revised: 09/27/2020 Document Reviewed: 09/27/2020 Elsevier Patient Education  2023 [employer]  

## 2022-03-17 NOTE — Telephone Encounter (Signed)
The pt came in for his AWV appt today complaining of several episodes of intermittent roaring sound in his right ear and what is described as a heart flutter. He notices this mostly at rest but denies any chest pains, numbness, or tingling. He also denies any of these symptoms today.  I recommended scheduling an appt with Dr. Ronnald Ramp for his concerns. He declined to schedule an appt to address these symptoms. He did schedule an appt in December and felt like he could wait until then to address. I informed him if his symptoms return or worsening to seek medical care.

## 2022-03-22 ENCOUNTER — Ambulatory Visit
Admission: EM | Admit: 2022-03-22 | Discharge: 2022-03-22 | Disposition: A | Discharge status: Home / Self Care | Payer: Medicare PPO | Attending: Family Medicine | Admitting: Family Medicine

## 2022-03-22 ENCOUNTER — Encounter: Payer: Self-pay | Admitting: Emergency Medicine

## 2022-03-22 DIAGNOSIS — R001 Bradycardia, unspecified: Secondary | ICD-10-CM | POA: Diagnosis not present

## 2022-03-22 DIAGNOSIS — R002 Palpitations: Secondary | ICD-10-CM

## 2022-03-22 NOTE — ED Triage Notes (Signed)
Pt c/o heart palpations. He states he got his covid vaccine last week and he noticed it afterwards. He states it he notices it more when he is resting. Denies chest pain, shortness of breath, dizziness of sweats.

## 2022-03-22 NOTE — ED Provider Notes (Signed)
[facility]    CSN: [id number] Arrival date & time: 03/22/22  0830      History   Chief Complaint Chief Complaint  Patient presents with   Palpitations    HPI Victor Walsh is a 86 y.o. male.   HPI  Victor Walsh here for " vibrations" in his left chest that started about a week or 2 ago.  He is unsure if it started directly after getting the COVID-vaccine last week.  It tends to occur both at rest and with activity.  He denies any chest pain, shortness of breath, nausea, vomiting, diaphoresis, syncope, fall, lightheadedness.  States he is at his typically state of health.  No recent illnesses.  He takes a baby aspirin daily.  He has never seen a cardiologist before.  His told in the past he had an irregular heartbeat.  He notes that he only goes to the doctor when he needs to for his physicals once a year.    Past Medical History:  Diagnosis Date   BPH (benign prostatic hyperplasia)    Chronic kidney disease    Hyperlipidemia    Hypertension    Stomach ulcer     Patient Active Problem List   Diagnosis Date Noted   Benign prostatic hyperplasia with weak urinary stream 07/02/2019   Chronic kidney disease (CKD), stage III (moderate) ([place]) 01/29/2019   Essential hypertension 02/09/2015   Hyperlipidemia type III 02/09/2015   Benign prostatic hypertrophy 02/09/2015   Vitamin D deficiency 02/09/2015    Past Surgical History:  Procedure Laterality Date   COLONOSCOPY  2013   cleared- Dr Gustavo Lah   COLONOSCOPY N/A 08/22/2016   Procedure: COLONOSCOPY;  Surgeon: Lollie Sails, MD;  Location[facility];  Service: Endoscopy;  Laterality: N/A;   COLONOSCOPY W/ POLYPECTOMY     HERNIA REPAIR         Home Medications    Prior to Admission medications   Medication Sig Start Date End Date Taking? Authorizing Provider  amLODipine (NORVASC) 5 MG tablet Take 1 tablet (5 mg total) by mouth daily. 11/16/21  Yes Juline Patch, MD  aspirin EC 81 MG tablet Take by mouth.    Yes [provider]  atorvastatin (LIPITOR) 10 MG tablet TAKE 1 TABLET(10 MG) BY MOUTH DAILY 11/16/21  Yes Juline Patch, MD  Vitamin D, Ergocalciferol, (DRISDOL) 1.25 MG (50000 UNIT) CAPS capsule Take 1 pill every 30 days 12/22/21  Yes Juline Patch, MD  dutasteride (AVODART) 0.5 MG capsule TAKE 1 CAPSULE BY MOUTH DAILY 11/16/21   Juline Patch, MD    Family History Family History  Problem Relation Age of Onset   Kidney disease Mother    Cancer Father        stomach cancer    Social History Social History   Tobacco Use   Smoking status: Never   Smokeless tobacco: Never   Tobacco comments:    smoking cessation materials not required  Vaping Use   Vaping Use: Never used  Substance Use Topics   Alcohol use: No    Alcohol/week: 0.0 standard drinks of alcohol   Drug use: No     Allergies   Patient has no known allergies.   Review of Systems Review of Systems :negative unless otherwise stated in HPI.      Physical Exam Triage Vital Signs ED Triage Vitals  Enc Vitals Group     BP      Pulse      Resp  Temp      Temp src      SpO2      Weight      Height      Head Circumference      Peak Flow      Pain Score      Pain Loc      Pain Edu?      Excl. in [place]?    No data found.  Updated Vital Signs BP (!) 157/96 (BP Location: Right Arm)   Pulse (!) 53   Temp 97.6 F (36.4 C) (Oral)   Resp 16   Ht '5\' 9"'$  (1.753 m)   Wt 86.4 kg   SpO2 98%   BMI 28.13 kg/m   Visual Acuity Right Eye Distance:   Left Eye Distance:   Bilateral Distance:    Right Eye Near:   Left Eye Near:    Bilateral Near:     Physical Exam  GEN: pleasant well appearing male, in no acute distress   CV: regular rate and irregular rhythm,  no murmurs, rubs or gallops  appreciated, no JVP   CHEST WALL: Nontender to palpation RESP: no increased work of breathing, clear to ascultation bilaterally MSK: no LE edema SKIN: warm, dry NEURO: alert, oriented at baseline, moves  all extremities appropriately PSYCH: Normal affect, appropriate speech and behavior   UC Treatments / Results  Labs (all labs ordered are listed, but only abnormal results are displayed) Labs Reviewed - No data to display  EKG   Radiology No results found.  Procedures Procedures (including critical care time)  Medications Ordered in UC Medications - No data to display  Initial Impression / Assessment and Plan / UC Course  I have reviewed the triage vital signs and the nursing notes.  Pertinent labs & imaging results that were available during my care of the patient were reviewed by me and considered in my medical decision making (see chart for details).       Patient is a 86 y.o. male with history of hypertension, hyperlipidemia and CKD who presents laboratory like chest pain on the left side for the past 2 weeks.  In EKG on arrival with marked bradycardia heart rate 48, without acute ST and T wave changes, personally reviewed by me.  Previous EKG 5 years ago without sinus bradycardia.  On exam, he is not bradycardic any longer.  Heart rate was 62.  Labs offered but declined.  He is hypertensive and did not take his medications today.  Says that he ate a couple of pork chops yesterday.  Reports a regularly taking his amlodipine.  Advised patient to take this medication when he gets home.  Initially considered ED evaluation however patient is stable for cardiology outpatient work-up.  I am concerned he may have sick sinus syndrome.  On chart review, he had his Medicare wellness exam on March 17, 2022.  He was lying seen by his PCP on November 16, 2021 his blood pressure was at goal with amlodipine 5 mg.   Referral placed for cardiology. ED precautions given and patient voiced understanding. Discussed MDM, treatment plan and plan for follow-up with patient/parent who agrees with plan.       Final Clinical Impressions(s) / UC Diagnoses   Final diagnoses:  Palpitations   Bradycardia     Discharge Instructions      Your heart rate was low.  A referral was placed to cardiology,  it may take 1 to 2 weeks to schedule/process the  referral.  If you have not heard from the specialist in 2 weeks, please follow-up with your primary care provider for a new referral.  Be sure to take your blood pressure medications daily.  Follow up with Dr Ronnald Ramp in the next 1-2 weeks.       ED Prescriptions   None    PDMP not reviewed this encounter.   Lyndee Hensen, DO 03/22/22 1016

## 2022-03-22 NOTE — Discharge Instructions (Addendum)
Your heart rate was low.  A referral was placed to cardiology,  it may take 1 to 2 weeks to schedule/process the referral.  If you have not heard from the specialist in 2 weeks, please follow-up with your primary care provider for a new referral.  Be sure to take your blood pressure medications daily.  Follow up with Dr Ronnald Ramp in the next 1-2 weeks.

## 2022-04-11 DIAGNOSIS — Z86010 Personal history of colonic polyps: Secondary | ICD-10-CM | POA: Diagnosis not present

## 2022-05-17 ENCOUNTER — Encounter: Payer: Self-pay | Admitting: Family Medicine

## 2022-05-17 ENCOUNTER — Ambulatory Visit: Payer: Medicare PPO | Admitting: Family Medicine

## 2022-05-17 DIAGNOSIS — E559 Vitamin D deficiency, unspecified: Secondary | ICD-10-CM | POA: Diagnosis not present

## 2022-05-17 DIAGNOSIS — E782 Mixed hyperlipidemia: Secondary | ICD-10-CM | POA: Diagnosis not present

## 2022-05-17 DIAGNOSIS — R3912 Poor urinary stream: Secondary | ICD-10-CM | POA: Diagnosis not present

## 2022-05-17 DIAGNOSIS — I10 Essential (primary) hypertension: Secondary | ICD-10-CM

## 2022-05-17 DIAGNOSIS — N401 Enlarged prostate with lower urinary tract symptoms: Secondary | ICD-10-CM

## 2022-05-17 MED ORDER — VITAMIN D (ERGOCALCIFEROL) 1.25 MG (50000 UNIT) PO CAPS: ORAL_CAPSULE | ORAL | 1 refills | Status: DC

## 2022-05-17 MED ORDER — AMLODIPINE BESYLATE 5 MG PO TABS: 5.0000 mg | ORAL_TABLET | Freq: Every day | ORAL | 1 refills | Status: DC

## 2022-05-17 MED ORDER — ATORVASTATIN CALCIUM 10 MG PO TABS: ORAL_TABLET | ORAL | 1 refills | Status: DC

## 2022-05-17 MED ORDER — DUTASTERIDE 0.5 MG PO CAPS: ORAL_CAPSULE | ORAL | 1 refills | Status: DC

## 2022-05-17 NOTE — Progress Notes (Signed)
Date:  05/17/2022   Name:  Victor Walsh   DOB:  [birth year]   MRN:  [id number]   Chief Complaint: Hypertension, Hyperlipidemia, and vitamin d deficiency  Hypertension This is a chronic problem. The current episode started more than 1 year ago. The problem has been gradually improving since onset. The problem is controlled. Pertinent negatives include no anxiety, blurred vision, chest pain, headaches, malaise/fatigue, neck pain, orthopnea, palpitations, peripheral edema, PND, shortness of breath or sweats. There are no associated agents to hypertension. There are no known risk factors for coronary artery disease. Past treatments include calcium channel blockers. The current treatment provides moderate improvement. There are no compliance problems.  There is no history of angina, kidney disease, CAD/MI, CVA or left ventricular hypertrophy. There is no history of chronic renal disease, a hypertension causing med or renovascular disease.  Hyperlipidemia This is a chronic problem. The current episode started more than 1 year ago. The problem is controlled. Recent lipid tests were reviewed and are normal. He has no history of chronic renal disease. Pertinent negatives include no chest pain, myalgias or shortness of breath. Current antihyperlipidemic treatment includes statins. The current treatment provides moderate improvement of lipids. There are no compliance problems.  Risk factors for coronary artery disease include dyslipidemia and hypertension.  Benign Prostatic Hypertrophy This is a chronic problem. The current episode started more than 1 year ago. The problem is unchanged. Irritative symptoms include nocturia. Irritative symptoms do not include frequency or urgency. Obstructive symptoms do not include an intermittent stream or a weak stream. Pertinent negatives include no chills, dysuria, hematuria, hesitancy or nausea. Past treatments include dutasteride. The treatment provided moderate relief.     Lab Results  Component Value Date   NA 140 11/16/2021   K 4.1 11/16/2021   CO2 21 11/16/2021   GLUCOSE 105 (H) 11/16/2021   BUN 17 11/16/2021   CREATININE 1.59 (H) 11/16/2021   CALCIUM 9.1 11/16/2021   EGFR 43 (L) 11/16/2021   GFRNONAA 51 (L) 04/14/2020   Lab Results  Component Value Date   CHOL 161 05/17/2021   HDL 53 05/17/2021   LDLCALC 90 05/17/2021   TRIG 95 05/17/2021   CHOLHDL 4.2 01/01/2019   No results found for: "TSH" No results found for: "HGBA1C" Lab Results  Component Value Date   WBC 5.0 10/18/2017   HGB 14.9 10/18/2017   HCT 44.8 10/18/2017   MCV 99 (H) 10/18/2017   PLT 156 10/18/2017   Lab Results  Component Value Date   ALT 21 05/17/2021   AST 28 05/17/2021   ALKPHOS 62 05/17/2021   BILITOT 0.4 05/17/2021   No results found for: "25OHVITD2", "25OHVITD3", "VD25OH"   Review of Systems  Constitutional:  Negative for chills, fever and malaise/fatigue.  HENT:  Negative for drooling, ear discharge, ear pain and sore throat.   Eyes:  Negative for blurred vision.  Respiratory:  Negative for cough, shortness of breath and wheezing.   Cardiovascular:  Negative for chest pain, palpitations, orthopnea, leg swelling and PND.  Gastrointestinal:  Negative for abdominal pain, blood in stool, constipation, diarrhea and nausea.  Endocrine: Negative for polydipsia.  Genitourinary:  Positive for nocturia. Negative for dysuria, frequency, hematuria, hesitancy and urgency.  Musculoskeletal:  Negative for back pain, myalgias and neck pain.  Skin:  Negative for rash.  Allergic/Immunologic: Negative for environmental allergies.  Neurological:  Negative for dizziness and headaches.  Hematological:  Does not bruise/bleed easily.  Psychiatric/Behavioral:  Negative for suicidal ideas. The  patient is not nervous/anxious.     Patient Active Problem List   Diagnosis Date Noted   Benign prostatic hyperplasia with weak urinary stream 07/02/2019   Chronic kidney disease  (CKD), stage III (moderate) ([place]) 01/29/2019   Essential hypertension 02/09/2015   Hyperlipidemia type III 02/09/2015   Benign prostatic hypertrophy 02/09/2015   Vitamin D deficiency 02/09/2015    No Known Allergies  Past Surgical History:  Procedure Laterality Date   COLONOSCOPY  2013   cleared- Dr Gustavo Lah   COLONOSCOPY N/A 08/22/2016   Procedure: COLONOSCOPY;  Surgeon: Lollie Sails, MD;  Location: [facility] ENDOSCOPY;  Service: Endoscopy;  Laterality: N/A;   COLONOSCOPY W/ POLYPECTOMY     HERNIA REPAIR      Social History   Tobacco Use   Smoking status: Never   Smokeless tobacco: Never   Tobacco comments:    smoking cessation materials not required  Vaping Use   Vaping Use: Never used  Substance Use Topics   Alcohol use: No    Alcohol/week: 0.0 standard drinks of alcohol   Drug use: No     Medication list has been reviewed and updated.  Current Meds  Medication Sig   amLODipine (NORVASC) 5 MG tablet Take 1 tablet (5 mg total) by mouth daily.   aspirin EC 81 MG tablet Take by mouth.   atorvastatin (LIPITOR) 10 MG tablet TAKE 1 TABLET(10 MG) BY MOUTH DAILY   dutasteride (AVODART) 0.5 MG capsule TAKE 1 CAPSULE BY MOUTH DAILY   Vitamin D, Ergocalciferol, (DRISDOL) 1.25 MG (50000 UNIT) CAPS capsule Take 1 pill every 30 days       05/17/2022   11:06 AM 03/17/2022    2:26 PM 11/16/2021    8:32 AM 10/05/2020    2:16 PM  GAD 7 : Generalized Anxiety Score  Nervous, Anxious, on Edge 0 0 0 0  Control/stop worrying 0 0 0 0  Worry too much - different things 0 0 0 0  Trouble relaxing 0 0 0 0  Restless 0 0 0 0  Easily annoyed or irritable 0 0 0 0  Afraid - awful might happen 0 0 0 0  Total GAD 7 Score 0 0 0 0  Anxiety Difficulty Not difficult at all Not difficult at all Not difficult at all        05/17/2022   11:06 AM 03/17/2022    2:25 PM 11/16/2021    8:32 AM  Depression screen PHQ 2/9  Decreased Interest 0 0 0  Down, Depressed, Hopeless 0 0 0  PHQ - 2 Score  0 0 0  Altered sleeping 0 0 0  Tired, decreased energy 0 0 0  Change in appetite 0 0 0  Feeling bad or failure about yourself  0 0 0  Trouble concentrating 0 0 0  Moving slowly or fidgety/restless 0 0 0  Suicidal thoughts 0 0 0  PHQ-9 Score 0 0 0  Difficult doing work/chores Not difficult at all Not difficult at all Not difficult at all    BP Readings from Last 3 Encounters:  05/17/22 128/74  03/22/22 (!) 157/96  03/17/22 136/86    Physical Exam Vitals and nursing note reviewed.  HENT:     Head: Normocephalic.     Right Ear: External ear normal.     Left Ear: External ear normal.     Nose: Nose normal.     Mouth/Throat:     Mouth: Mucous membranes are moist.  Eyes:  General: No scleral icterus.       Right eye: No discharge.        Left eye: No discharge.     Conjunctiva/sclera: Conjunctivae normal.     Pupils: Pupils are equal, round, and reactive to light.  Neck:     Thyroid: No thyromegaly.     Vascular: No JVD.     Trachea: No tracheal deviation.  Cardiovascular:     Rate and Rhythm: Normal rate and regular rhythm.     Heart sounds: Normal heart sounds. No murmur heard.    No friction rub. No gallop.  Pulmonary:     Effort: No respiratory distress.     Breath sounds: Normal breath sounds. No wheezing, rhonchi or rales.  Abdominal:     General: Bowel sounds are normal.     Palpations: Abdomen is soft. There is no mass.     Tenderness: There is no abdominal tenderness. There is no guarding or rebound.  Musculoskeletal:        General: No tenderness. Normal range of motion.     Cervical back: Normal range of motion and neck supple.  Lymphadenopathy:     Cervical: No cervical adenopathy.  Skin:    General: Skin is warm.     Findings: No rash.  Neurological:     Mental Status: He is alert.     Wt Readings from Last 3 Encounters:  05/17/22 190 lb (86.2 kg)  03/22/22 190 lb 7.6 oz (86.4 kg)  03/17/22 190 lb 6.4 oz (86.4 kg)    BP 128/74   Pulse 78    Ht _0  (1.753 m)   Wt 190 lb (86.2 kg)   SpO2 97%   BMI 28.06 kg/m   Assessment and Plan: 1. Essential hypertension Chronic.  Controlled.  Stable.  Blood pressure 128/74.  Continue amlodipine 5 mg once a day.  Will check CMP for electrolytes and GFR.  Will recheck in 6 months. - Comprehensive Metabolic Panel (CMET) - amLODipine (NORVASC) 5 MG tablet; Take 1 tablet (5 mg total) by mouth daily.  Dispense: 90 tablet; Refill: 1  2. Hyperlipidemia type III Chronic.  Controlled.  Stable.  Continue atorvastatin 10 mg once a day.  Will check lipid panel for current LDL surveillance. - Lipid Panel With LDL/HDL Ratio - atorvastatin (LIPITOR) 10 MG tablet; TAKE 1 TABLET(10 MG) BY MOUTH DAILY  Dispense: 90 tablet; Refill: 1  3. Benign prostatic hyperplasia with weak urinary stream Chronic.  Controlled.  Stable.  Mild elevated to 2.1 from 1.9 on previous PSA we will recheck to see if this is a trend or whether the PSA has stabilized.  In the meantime we will continue with Avodart 0.5 once a day. - dutasteride (AVODART) 0.5 MG capsule; TAKE 1 CAPSULE BY MOUTH DAILY  Dispense: 90 capsule; Refill: 1 - PSA  4. Vitamin D deficiency Chronic.  Controlled.  Stable.  Continue with current dosing of vitamin D supplementation. - Vitamin D, Ergocalciferol, (DRISDOL) 1.25 MG (50000 UNIT) CAPS capsule; Take 1 pill every 30 days  Dispense: 3 capsule; Refill: 1     Otilio Miu, MD

## 2022-05-18 LAB — COMPREHENSIVE METABOLIC PANEL
ALT: 18 IU/L (ref 0–44)
AST: 17 IU/L (ref 0–40)
Albumin/Globulin Ratio: 1.8 (ref 1.2–2.2)
Albumin: 4.2 g/dL (ref 3.7–4.7)
Alkaline Phosphatase: 56 IU/L (ref 44–121)
BUN/Creatinine Ratio: 14 (ref 10–24)
BUN: 20 mg/dL (ref 8–27)
Bilirubin Total: 0.4 mg/dL (ref 0.0–1.2)
CO2: 23 mmol/L (ref 20–29)
Calcium: 9.4 mg/dL (ref 8.6–10.2)
Chloride: 106 mmol/L (ref 96–106)
Creatinine, Ser: 1.42 mg/dL — ABNORMAL HIGH (ref 0.76–1.27)
Globulin, Total: 2.4 g/dL (ref 1.5–4.5)
Glucose: 98 mg/dL (ref 70–99)
Potassium: 4.9 mmol/L (ref 3.5–5.2)
Sodium: 141 mmol/L (ref 134–144)
Total Protein: 6.6 g/dL (ref 6.0–8.5)
eGFR: 49 mL/min/{1.73_m2} — ABNORMAL LOW (ref >59)

## 2022-05-18 LAB — LIPID PANEL WITH LDL/HDL RATIO
Cholesterol, Total: 172 mg/dL (ref 100–199)
HDL: 55 mg/dL (ref >39)
LDL Chol Calc (NIH): 100 mg/dL — ABNORMAL HIGH (ref 0–99)
LDL/HDL Ratio: 1.8 ratio (ref 0.0–3.6)
Triglycerides: 92 mg/dL (ref 0–149)
VLDL Cholesterol Cal: 17 mg/dL (ref 5–40)

## 2022-05-18 LAB — PSA: Prostate Specific Ag, Serum: 1.6 ng/mL (ref 0.0–4.0)

## 2022-07-06 ENCOUNTER — Encounter: Payer: Self-pay | Admitting: Gastroenterology

## 2022-07-06 NOTE — H&P (Incomplete)
Pre-Procedure H&P   Patient ID: Victor Walsh is a 87 y.o. male.  Gastroenterology Provider: Annamaria Helling, DO  Referring Provider: Laurine Blazer, PA PCP: Juline Patch, MD  Date: 07/06/2022  HPI [name] is a 87 y.o. male who presents today for Colonoscopy for Surveillance-history of colon polyp .  Patient is a very active 87 year old who is presenting for surveillance for history of colon polyps.  He reports daily bowel movement without melena hematochezia diarrhea or constipation.  He occasionally needs to do a laxative cleanout.  Most recent creatinine 1.42.  Underwent colonoscopy in April 2018 with 2 tubular adenomas and sigmoid diverticulosis.  Also had 2 tubular adenomas in 2012.   Past Medical History:  Diagnosis Date   BPH (benign prostatic hyperplasia)    Chronic kidney disease    Hyperlipidemia    Hypertension    Stomach ulcer     Past Surgical History:  Procedure Laterality Date   COLONOSCOPY  2013   cleared- Dr Gustavo Lah   COLONOSCOPY N/A 08/22/2016   Procedure: COLONOSCOPY;  Surgeon: Lollie Sails, MD;  Location: [facility] ENDOSCOPY;  Service: Endoscopy;  Laterality: N/A;   COLONOSCOPY W/ POLYPECTOMY     HERNIA REPAIR      Family History No h/o GI disease or malignancy  Review of Systems  Constitutional:  Negative for activity change, appetite change, chills, diaphoresis, fatigue, fever and unexpected weight change.  HENT:  Negative for trouble swallowing and voice change.   Respiratory:  Negative for shortness of breath and wheezing.   Cardiovascular:  Negative for chest pain, palpitations and leg swelling.  Gastrointestinal:  Positive for constipation. Negative for abdominal distention, abdominal pain, anal bleeding, blood in stool, diarrhea, nausea and vomiting.  Musculoskeletal:  Negative for arthralgias and myalgias.  Skin:  Negative for color change and pallor.  Neurological:  Negative for dizziness, syncope and weakness.   Psychiatric/Behavioral:  Negative for confusion. The patient is not nervous/anxious.   All other systems reviewed and are negative.    Medications No current facility-administered medications on file prior to encounter.   Current Outpatient Medications on File Prior to Encounter  Medication Sig Dispense Refill   aspirin EC 81 MG tablet Take by mouth.      Pertinent medications related to GI and procedure were reviewed by me with the patient prior to the procedure  No current facility-administered medications for this encounter.  Current Outpatient Medications:    amLODipine (NORVASC) 5 MG tablet, Take 1 tablet (5 mg total) by mouth daily., Disp: 90 tablet, Rfl: 1   aspirin EC 81 MG tablet, Take by mouth., Disp: , Rfl:    atorvastatin (LIPITOR) 10 MG tablet, TAKE 1 TABLET(10 MG) BY MOUTH DAILY, Disp: 90 tablet, Rfl: 1   dutasteride (AVODART) 0.5 MG capsule, TAKE 1 CAPSULE BY MOUTH DAILY, Disp: 90 capsule, Rfl: 1   Vitamin D, Ergocalciferol, (DRISDOL) 1.25 MG (50000 UNIT) CAPS capsule, Take 1 pill every 30 days, Disp: 3 capsule, Rfl: 1      No Known Allergies Allergies were reviewed by me prior to the procedure  Objective   There is no height or weight on file to calculate BMI. There were no vitals filed for this visit.  *** Physical Exam Vitals and nursing note reviewed.  Constitutional:      General: He is not in acute distress.    Appearance: Normal appearance. He is not ill-appearing, toxic-appearing or diaphoretic.  HENT:     Head: Normocephalic  and atraumatic.     Nose: Nose normal.     Mouth/Throat:     Mouth: Mucous membranes are moist.     Pharynx: Oropharynx is clear.  Eyes:     General: No scleral icterus.    Extraocular Movements: Extraocular movements intact.  Cardiovascular:     Rate and Rhythm: Normal rate and regular rhythm.     Heart sounds: Normal heart sounds. No murmur heard.    No friction rub. No gallop.  Pulmonary:     Effort: Pulmonary  effort is normal. No respiratory distress.     Breath sounds: Normal breath sounds. No wheezing, rhonchi or rales.  Abdominal:     General: Bowel sounds are normal. There is no distension.     Palpations: Abdomen is soft.     Tenderness: There is no abdominal tenderness. There is no guarding or rebound.  Musculoskeletal:     Cervical back: Neck supple.     Right lower leg: No edema.     Left lower leg: No edema.  Skin:    General: Skin is warm and dry.     Coloration: Skin is not jaundiced or pale.  Neurological:     General: No focal deficit present.     Mental Status: He is alert and oriented to person, place, and time. Mental status is at baseline.  Psychiatric:        Mood and Affect: Mood normal.        Behavior: Behavior normal.        Thought Content: Thought content normal.        Judgment: Judgment normal.      Assessment:  [name] is a 87 y.o. male  who presents today for Colonoscopy for Surveillance-history of colon polyp .  Plan:  Colonoscopy with possible intervention today  Colonoscopy with possible biopsy, control of bleeding, polypectomy, and interventions as necessary has been discussed with the patient/patient representative. Informed consent was obtained from the patient/patient representative after explaining the indication, nature, and risks of the procedure including but not limited to death, bleeding, perforation, missed neoplasm/lesions, cardiorespiratory compromise, and reaction to medications. Opportunity for questions was given and appropriate answers were provided. Patient/patient representative has verbalized understanding is amenable to undergoing the procedure.   Annamaria Helling, DO  [facility] Gastroenterology  Portions of the record may have been created with voice recognition software. Occasional wrong-word or 'sound-a-like' substitutions may have occurred due to the inherent limitations of voice recognition software.  Read the  chart carefully and recognize, using context, where substitutions may have occurred.

## 2022-07-07 ENCOUNTER — Encounter: Admission: RE | Payer: Self-pay | Source: Home / Self Care

## 2022-07-07 ENCOUNTER — Ambulatory Visit: Admission: RE | Admit: 2022-07-07 | Payer: Medicare PPO | Source: Home / Self Care | Admitting: Gastroenterology

## 2022-07-07 SURGERY — COLONOSCOPY
Anesthesia: General

## 2022-10-01 NOTE — H&P (Signed)
Pre-Procedure H&P   Patient ID: Victor Walsh is a 87 y.o. male.  Gastroenterology Provider: Jaynie Collins, DO  Referring Provider: Tawni Pummel, PA PCP: Duanne Limerick, MD  Date: 10/02/2022  HPI Victor Walsh is a 87 y.o. male who presents today for Colonoscopy for Surveillance-personal history of colon polyps .  Patient reports regular bowel movements without melena or hematochezia.  Most recent creatinine 1.42.  No family history colon cancer or colon polyps.  Last colonoscopy April 2018 demonstrating 2 tubular adenomatous polyps and left-sided diverticulosis.  Also had 1 adenomatous polyp in 2012   Past Medical History:  Diagnosis Date   BPH (benign prostatic hyperplasia)    Chronic kidney disease    Hyperlipidemia    Hypertension    Stomach ulcer     Past Surgical History:  Procedure Laterality Date   COLONOSCOPY  2013   cleared- Dr Marva Panda   COLONOSCOPY N/A 08/22/2016   Procedure: COLONOSCOPY;  Surgeon: Christena Deem, MD;  Location: [facility] ENDOSCOPY;  Service: Endoscopy;  Laterality: N/A;   COLONOSCOPY W/ POLYPECTOMY     HERNIA REPAIR      Family History No h/o GI disease or malignancy  Review of Systems  Constitutional:  Negative for activity change, appetite change, chills, diaphoresis, fatigue, fever and unexpected weight change.  HENT:  Negative for trouble swallowing and voice change.   Respiratory:  Negative for shortness of breath and wheezing.   Cardiovascular:  Negative for chest pain, palpitations and leg swelling.  Gastrointestinal:  Negative for abdominal distention, abdominal pain, anal bleeding, blood in stool, constipation, diarrhea, nausea and vomiting.  Musculoskeletal:  Negative for arthralgias and myalgias.  Skin:  Negative for color change and pallor.  Neurological:  Negative for dizziness, syncope and weakness.  Psychiatric/Behavioral:  Negative for confusion. The patient is not nervous/anxious.   All other systems reviewed  and are negative.    Medications No current facility-administered medications on file prior to encounter.   Current Outpatient Medications on File Prior to Encounter  Medication Sig Dispense Refill   amLODipine (NORVASC) 5 MG tablet Take 1 tablet (5 mg total) by mouth daily. 90 tablet 1   aspirin EC 81 MG tablet Take by mouth.     Vitamin D, Ergocalciferol, (DRISDOL) 1.25 MG (50000 UNIT) CAPS capsule Take 1 pill every 30 days 3 capsule 1   atorvastatin (LIPITOR) 10 MG tablet TAKE 1 TABLET(10 MG) BY MOUTH DAILY (Patient not taking: Reported on 10/02/2022) 90 tablet 1   dutasteride (AVODART) 0.5 MG capsule TAKE 1 CAPSULE BY MOUTH DAILY (Patient not taking: Reported on 10/02/2022) 90 capsule 1    Pertinent medications related to GI and procedure were reviewed by me with the patient prior to the procedure   Current Facility-Administered Medications:    0.9 %  sodium chloride infusion, , Intravenous, Continuous, Jaynie Collins, DO, Last Rate: 20 mL/hr at 10/02/22 1351, 1,000 mL at 10/02/22 1351  sodium chloride 1,000 mL (10/02/22 1351)       No Known Allergies Allergies were reviewed by me prior to the procedure  Objective   Body mass index is 26.93 kg/m. Vitals:   10/02/22 1335  BP: (!) 145/95  Pulse: 93  Resp: 18  Temp: (!) 96.4 F (35.8 C)  TempSrc: Temporal  SpO2: 96%  Weight: 82.7 kg  Height: 5\' 9"  (1.753 m)     Physical Exam Vitals and nursing note reviewed.  Constitutional:      General: He is  not in acute distress.    Appearance: Normal appearance. He is not ill-appearing, toxic-appearing or diaphoretic.  HENT:     Head: Normocephalic and atraumatic.     Nose: Nose normal.     Mouth/Throat:     Mouth: Mucous membranes are moist.     Pharynx: Oropharynx is clear.  Eyes:     General: No scleral icterus.    Extraocular Movements: Extraocular movements intact.  Cardiovascular:     Rate and Rhythm: Normal rate and regular rhythm.     Heart sounds:  Normal heart sounds. No murmur heard.    No friction rub. No gallop.  Pulmonary:     Effort: Pulmonary effort is normal. No respiratory distress.     Breath sounds: Normal breath sounds. No wheezing, rhonchi or rales.  Abdominal:     General: Bowel sounds are normal. There is no distension.     Palpations: Abdomen is soft.     Tenderness: There is no abdominal tenderness. There is no guarding or rebound.  Musculoskeletal:     Cervical back: Neck supple.     Right lower leg: No edema.     Left lower leg: No edema.  Skin:    General: Skin is warm and dry.     Coloration: Skin is not jaundiced or pale.  Neurological:     General: No focal deficit present.     Mental Status: He is alert and oriented to person, place, and time. Mental status is at baseline.  Psychiatric:        Mood and Affect: Mood normal.        Behavior: Behavior normal.        Thought Content: Thought content normal.        Judgment: Judgment normal.      Assessment:  Victor Walsh is a 87 y.o. male  who presents today for Colonoscopy for Surveillance-personal history of colon polyps .  Plan:  Colonoscopy with possible intervention today  Colonoscopy with possible biopsy, control of bleeding, polypectomy, and interventions as necessary has been discussed with the patient/patient representative. Informed consent was obtained from the patient/patient representative after explaining the indication, nature, and risks of the procedure including but not limited to death, bleeding, perforation, missed neoplasm/lesions, cardiorespiratory compromise, and reaction to medications. Opportunity for questions was given and appropriate answers were provided. Patient/patient representative has verbalized understanding is amenable to undergoing the procedure.   Jaynie Collins, DO  [facility] Gastroenterology  Portions of the record may have been created with voice recognition software. Occasional wrong-word or  'sound-a-like' substitutions may have occurred due to the inherent limitations of voice recognition software.  Read the chart carefully and recognize, using context, where substitutions may have occurred.

## 2022-10-02 ENCOUNTER — Ambulatory Visit
Admission: RE | Admit: 2022-10-02 | Discharge: 2022-10-02 | Disposition: A | Discharge status: Home / Self Care | Payer: Medicare PPO | Attending: Gastroenterology | Admitting: Gastroenterology

## 2022-10-02 ENCOUNTER — Encounter
Admission: RE | Disposition: A | Discharge status: Home / Self Care | Payer: Self-pay | Source: Home / Self Care | Attending: Gastroenterology

## 2022-10-02 ENCOUNTER — Ambulatory Visit: Payer: Medicare PPO | Admitting: Anesthesiology

## 2022-10-02 ENCOUNTER — Encounter: Payer: Self-pay | Admitting: Gastroenterology

## 2022-10-02 DIAGNOSIS — N189 Chronic kidney disease, unspecified: Secondary | ICD-10-CM | POA: Diagnosis not present

## 2022-10-02 DIAGNOSIS — D120 Benign neoplasm of cecum: Secondary | ICD-10-CM | POA: Insufficient documentation

## 2022-10-02 DIAGNOSIS — Z86010 Personal history of colonic polyps: Secondary | ICD-10-CM | POA: Diagnosis not present

## 2022-10-02 DIAGNOSIS — K640 First degree hemorrhoids: Secondary | ICD-10-CM | POA: Insufficient documentation

## 2022-10-02 DIAGNOSIS — I129 Hypertensive chronic kidney disease with stage 1 through stage 4 chronic kidney disease, or unspecified chronic kidney disease: Secondary | ICD-10-CM | POA: Insufficient documentation

## 2022-10-02 DIAGNOSIS — K649 Unspecified hemorrhoids: Secondary | ICD-10-CM | POA: Diagnosis not present

## 2022-10-02 DIAGNOSIS — Z1211 Encounter for screening for malignant neoplasm of colon: Secondary | ICD-10-CM | POA: Diagnosis not present

## 2022-10-02 DIAGNOSIS — I4510 Unspecified right bundle-branch block: Secondary | ICD-10-CM | POA: Diagnosis not present

## 2022-10-02 DIAGNOSIS — D123 Benign neoplasm of transverse colon: Secondary | ICD-10-CM | POA: Diagnosis not present

## 2022-10-02 DIAGNOSIS — Z79899 Other long term (current) drug therapy: Secondary | ICD-10-CM | POA: Insufficient documentation

## 2022-10-02 DIAGNOSIS — E785 Hyperlipidemia, unspecified: Secondary | ICD-10-CM | POA: Insufficient documentation

## 2022-10-02 DIAGNOSIS — Z09 Encounter for follow-up examination after completed treatment for conditions other than malignant neoplasm: Secondary | ICD-10-CM | POA: Diagnosis not present

## 2022-10-02 DIAGNOSIS — N400 Enlarged prostate without lower urinary tract symptoms: Secondary | ICD-10-CM | POA: Diagnosis not present

## 2022-10-02 DIAGNOSIS — D124 Benign neoplasm of descending colon: Secondary | ICD-10-CM | POA: Diagnosis not present

## 2022-10-02 DIAGNOSIS — K635 Polyp of colon: Secondary | ICD-10-CM | POA: Diagnosis not present

## 2022-10-02 DIAGNOSIS — K5730 Diverticulosis of large intestine without perforation or abscess without bleeding: Secondary | ICD-10-CM | POA: Insufficient documentation

## 2022-10-02 HISTORY — PX: COLONOSCOPY WITH PROPOFOL: SHX5780

## 2022-10-02 LAB — HM COLONOSCOPY

## 2022-10-02 SURGERY — COLONOSCOPY WITH PROPOFOL
Anesthesia: General

## 2022-10-02 MED ORDER — LIDOCAINE HCL (CARDIAC) PF 100 MG/5ML IV SOSY
PREFILLED_SYRINGE | INTRAVENOUS | Status: DC | PRN
  Administered 2022-10-02: 50 mg via INTRAVENOUS

## 2022-10-02 MED ORDER — SODIUM CHLORIDE 0.9 % IV SOLN
INTRAVENOUS | Status: DC
  Administered 2022-10-02: 1000 mL via INTRAVENOUS

## 2022-10-02 MED ORDER — PROPOFOL 10 MG/ML IV BOLUS
INTRAVENOUS | Status: AC
  Filled 2022-10-02: qty 20

## 2022-10-02 MED ORDER — PROPOFOL 10 MG/ML IV BOLUS
INTRAVENOUS | Status: DC | PRN
  Administered 2022-10-02: 30 mg via INTRAVENOUS
  Administered 2022-10-02: 70 mg via INTRAVENOUS

## 2022-10-02 MED ORDER — PROPOFOL 500 MG/50ML IV EMUL
INTRAVENOUS | Status: DC | PRN
  Administered 2022-10-02: 75 ug/kg/min via INTRAVENOUS

## 2022-10-02 NOTE — Op Note (Signed)
[facility] Gastroenterology Patient Name: Victor Walsh Procedure Date: 10/02/2022 2:02 PM MRN: [id number] Account #: [id number] Date of Birth: [birth year] Admit Type: Outpatient Age: 87 Room: [facility] ENDO ROOM 2 Gender: Male Note Status: Finalized Instrument Name: Colonoscope [id number] Procedure:             Colonoscopy Indications:           High risk colon cancer surveillance: Personal history                         of colonic polyps Providers:             Jaynie Collins DO, DO Referring MD:          Duanne Limerick, MD (Referring MD) Medicines:             Monitored Anesthesia Care Complications:         No immediate complications. Estimated blood loss:                         Minimal. Procedure:             Pre-Anesthesia Assessment:                        - Prior to the procedure, a History and Physical was                         performed, and patient medications and allergies were                         reviewed. The patient is competent. The risks and                         benefits of the procedure and the sedation options and                         risks were discussed with the patient. All questions                         were answered and informed consent was obtained.                         Patient identification and proposed procedure were                         verified by the physician, the nurse, the anesthetist                         and the technician in the endoscopy suite. Mental                         Status Examination: alert and oriented. Airway                         Examination: normal oropharyngeal airway and neck                         mobility. Respiratory Examination: clear to  auscultation. CV Examination: RRR, no murmurs, no S3                         or S4. Prophylactic Antibiotics: The patient does not                         require prophylactic antibiotics. Prior                          Anticoagulants: The patient has taken no anticoagulant                         or antiplatelet agents. ASA Grade Assessment: III - A                         patient with severe systemic disease. After reviewing                         the risks and benefits, the patient was deemed in                         satisfactory condition to undergo the procedure. The                         anesthesia plan was to use monitored anesthesia care                         (MAC). Immediately prior to administration of                         medications, the patient was re-assessed for adequacy                         to receive sedatives. The heart rate, respiratory                         rate, oxygen saturations, blood pressure, adequacy of                         pulmonary ventilation, and response to care were                         monitored throughout the procedure. The physical                         status of the patient was re-assessed after the                         procedure.                        After obtaining informed consent, the colonoscope was                         passed under direct vision. Throughout the procedure,                         the patient's blood pressure, pulse, and oxygen  saturations were monitored continuously. The                         Colonoscope was introduced through the anus and                         advanced to the the terminal ileum, with                         identification of the appendiceal orifice and IC                         valve. The colonoscopy was performed without                         difficulty. The patient tolerated the procedure well.                         The quality of the bowel preparation was evaluated                         using the BBPS [facility] Bowel Preparation Scale) with                         scores of: Right Colon = 2 (minor amount of residual                         staining, small fragments  of stool and/or opaque                         liquid, but mucosa seen well), Transverse Colon = 2                         (minor amount of residual staining, small fragments of                         stool and/or opaque liquid, but mucosa seen well) and                         Left Colon = 2 (minor amount of residual staining,                         small fragments of stool and/or opaque liquid, but                         mucosa seen well). The total BBPS score equals 6. The                         quality of the bowel preparation was good. The                         terminal ileum, ileocecal valve, appendiceal orifice,                         and rectum were photographed. Findings:      The perianal and digital rectal examinations were normal. Pertinent       negatives include normal  sphincter tone.      The terminal ileum appeared normal. Estimated blood loss: none.      Four sessile polyps were found in the descending colon (1), transverse       colon (2) and cecum (1). The polyps were 1 to 2 mm in size. These polyps       were removed with a jumbo cold forceps. Resection and retrieval were       complete. Estimated blood loss was minimal.      A 3 to 4 mm polyp was found in the transverse colon. The polyp was       sessile. The polyp was removed with a cold snare. Resection and       retrieval were complete. Estimated blood loss was minimal.      Multiple small-mouthed diverticula were found in the left colon.       Estimated blood loss: none.      Non-bleeding internal hemorrhoids were found during retroflexion. The       hemorrhoids were Grade I (internal hemorrhoids that do not prolapse).       Estimated blood loss: none.      The exam was otherwise without abnormality on direct and retroflexion       views. Impression:            - The examined portion of the ileum was normal.                        - Four 1 to 2 mm polyps in the descending colon, in                          the transverse colon and in the cecum, removed with a                         jumbo cold forceps. Resected and retrieved.                        - One 3 to 4 mm polyp in the transverse colon, removed                         with a cold snare. Resected and retrieved.                        - Diverticulosis in the left colon.                        - Non-bleeding internal hemorrhoids.                        - The examination was otherwise normal on direct and                         retroflexion views. Recommendation:        - Patient has a contact number available for                         emergencies. The signs and symptoms of potential                         delayed complications were discussed with the patient.  Return to normal activities tomorrow. Written                         discharge instructions were provided to the patient.                        - Discharge patient to home.                        - Resume previous diet.                        - Continue present medications.                        - No ibuprofen, naproxen, or other non-steroidal                         anti-inflammatory drugs for 5 days after polyp removal.                        - Await pathology results.                        - No further surveillance colonoscopies or                         fit/fobt/cologuard testing given age                        - Return to referring physician as previously                         scheduled.                        - The findings and recommendations were discussed with                         the patient. Procedure Code(s):     --- Professional ---                        [phone], Colonoscopy, flexible; with removal of                         tumor(s), polyp(s), or other lesion(s) by snare                         technique                        [id number], 59, Colonoscopy, flexible; with biopsy, single                         or multiple Diagnosis  Code(s):     --- Professional ---                        Z86.010, Personal history of colonic polyps                        K64.0, First degree hemorrhoids  D12.4, Benign neoplasm of descending colon                        D12.3, Benign neoplasm of transverse colon (hepatic                         flexure or splenic flexure)                        D12.0, Benign neoplasm of cecum                        K57.30, Diverticulosis of large intestine without                         perforation or abscess without bleeding CPT copyright 2022 American [employer] Association. All rights reserved. The codes documented in this report are preliminary and upon coder review may  be revised to meet current compliance requirements. Attending Participation:      I personally performed the entire procedure. Elfredia Nevins, DO Jaynie Collins DO, DO 10/02/2022 2:45:53 PM This report has been signed electronically. Number of Addenda: 0 Note Initiated On: 10/02/2022 2:02 PM Scope Withdrawal Time: 0 hours 19 minutes 45 seconds  Total Procedure Duration: 0 hours 22 minutes 43 seconds  Estimated Blood Loss:  Estimated blood loss was minimal.      [facility]

## 2022-10-02 NOTE — Anesthesia Preprocedure Evaluation (Addendum)
Anesthesia Evaluation  Patient identified by MRN, date of birth, ID band Patient awake    Reviewed: Allergy & Precautions, NPO status , Patient's Chart, lab work & pertinent test results  History of Anesthesia Complications Negative for: history of anesthetic complications  Airway Mallampati: III   Neck ROM: Full    Dental  (+) Missing   Pulmonary neg pulmonary ROS   Pulmonary exam normal breath sounds clear to auscultation       Cardiovascular hypertension, Normal cardiovascular exam Rhythm:Regular Rate:Normal  ECG 03/22/22:  Marked sinus bradycardia with Premature atrial complexes Right bundle branch block   Neuro/Psych negative neurological ROS     GI/Hepatic PUD,,,  Endo/Other  negative endocrine ROS    Renal/GU Renal disease (CKD)   BPH    Musculoskeletal   Abdominal   Peds  Hematology negative hematology ROS (+)   Anesthesia Other Findings   Reproductive/Obstetrics                             Anesthesia Physical Anesthesia Plan  ASA: 2  Anesthesia Plan: General   Post-op Pain Management:    Induction: Intravenous  PONV Risk Score and Plan: 2 and Propofol infusion, TIVA and Treatment may vary due to age or medical condition  Airway Management Planned: Natural Airway  Additional Equipment:   Intra-op Plan:   Post-operative Plan:   Informed Consent: I have reviewed the patients History and Physical, chart, labs and discussed the procedure including the risks, benefits and alternatives for the proposed anesthesia with the patient or authorized representative who has indicated his/her understanding and acceptance.       Plan Discussed with: CRNA  Anesthesia Plan Comments: (LMA/GETA backup discussed.  Patient consented for risks of anesthesia including but not limited to:  - adverse reactions to medications - damage to eyes, teeth, lips or other oral mucosa -  nerve damage due to positioning  - sore throat or hoarseness - damage to heart, brain, nerves, lungs, other parts of body or loss of life  Informed patient about role of CRNA in peri- and intra-operative care.  Patient voiced understanding.)        Anesthesia Quick Evaluation

## 2022-10-02 NOTE — Interval H&P Note (Signed)
History and Physical Interval Note: Preprocedure H&P from 10/02/22  was reviewed and there was no interval change after seeing and examining the patient.  Written consent was obtained from the patient after discussion of risks, benefits, and alternatives. Patient has consented to proceed with Colonoscopy with possible intervention   10/02/2022 2:07 PM  Victor Walsh  has presented today for surgery, with the diagnosis of Z86.010 (ICD-10-CM) - Personal history of colonic polyps.  The various methods of treatment have been discussed with the patient and family. After consideration of risks, benefits and other options for treatment, the patient has consented to  Procedure(s): COLONOSCOPY WITH PROPOFOL (N/A) as a surgical intervention.  The patient's history has been reviewed, patient examined, no change in status, stable for surgery.  I have reviewed the patient's chart and labs.  Questions were answered to the patient's satisfaction.     Jaynie Collins

## 2022-10-02 NOTE — Transfer of Care (Signed)
Immediate Anesthesia Transfer of Care Note  Patient: Victor Walsh  Procedure(s) Performed: COLONOSCOPY WITH PROPOFOL  Patient Location: PACU and Endoscopy Unit  Anesthesia Type:General  Level of Consciousness: unresponsive  Airway & Oxygen Therapy: Patient Spontanous Breathing and Patient connected to nasal cannula oxygen  Post-op Assessment: Report given to RN and Post -op Vital signs reviewed and stable  Post vital signs: Reviewed and stable  Last Vitals:  Vitals Value Taken Time  BP 114/73 10/02/22 1444  Temp 36.8 C 10/02/22 1444  Pulse 59 10/02/22 1445  Resp 21 10/02/22 1445  SpO2 97 % 10/02/22 1445  Vitals shown include unvalidated device data.  Last Pain:  Vitals:   10/02/22 1444  TempSrc: Temporal  PainSc: Asleep         Complications: No notable events documented.

## 2022-10-03 ENCOUNTER — Encounter: Payer: Self-pay | Admitting: Gastroenterology

## 2022-10-04 LAB — SURGICAL PATHOLOGY

## 2022-10-04 NOTE — Anesthesia Postprocedure Evaluation (Signed)
Anesthesia Post Note  Patient: [name]  Procedure(s) Performed: COLONOSCOPY WITH PROPOFOL  Patient location during evaluation: PACU Anesthesia Type: General Level of consciousness: awake and alert, oriented and patient cooperative Pain management: pain level controlled Vital Signs Assessment: post-procedure vital signs reviewed and stable Respiratory status: spontaneous breathing, nonlabored ventilation and respiratory function stable Cardiovascular status: blood pressure returned to baseline and stable Postop Assessment: adequate PO intake Anesthetic complications: no   No notable events documented.   Last Vitals:  Vitals:   10/02/22 1454 10/02/22 1504  BP: 137/86 (!) 143/93  Pulse:    Resp:    Temp:    SpO2:  95%    Last Pain:  Vitals:   10/02/22 1504  TempSrc:   PainSc: 0-No pain                 Reed Breech

## 2022-11-16 ENCOUNTER — Ambulatory Visit: Payer: Medicare PPO | Admitting: Family Medicine

## 2022-11-16 DIAGNOSIS — H524 Presbyopia: Secondary | ICD-10-CM | POA: Diagnosis not present

## 2022-11-22 ENCOUNTER — Encounter: Payer: Self-pay | Admitting: Family Medicine

## 2022-12-08 ENCOUNTER — Ambulatory Visit: Payer: Medicare PPO | Admitting: Family Medicine

## 2022-12-11 ENCOUNTER — Ambulatory Visit: Payer: Medicare PPO | Admitting: Family Medicine

## 2022-12-12 ENCOUNTER — Encounter: Payer: Self-pay | Admitting: Family Medicine

## 2022-12-12 ENCOUNTER — Ambulatory Visit: Payer: Medicare PPO | Admitting: Family Medicine

## 2022-12-12 ENCOUNTER — Ambulatory Visit (INDEPENDENT_AMBULATORY_CARE_PROVIDER_SITE_OTHER): Payer: Medicare PPO | Admitting: Family Medicine

## 2022-12-12 VITALS — BP 138/78 | HR 62 | Ht 69.0 in | Wt 187.0 lb

## 2022-12-12 DIAGNOSIS — I10 Essential (primary) hypertension: Secondary | ICD-10-CM | POA: Diagnosis not present

## 2022-12-12 DIAGNOSIS — E559 Vitamin D deficiency, unspecified: Secondary | ICD-10-CM

## 2022-12-12 DIAGNOSIS — E782 Mixed hyperlipidemia: Secondary | ICD-10-CM

## 2022-12-12 DIAGNOSIS — N401 Enlarged prostate with lower urinary tract symptoms: Secondary | ICD-10-CM

## 2022-12-12 DIAGNOSIS — R3912 Poor urinary stream: Secondary | ICD-10-CM

## 2022-12-12 MED ORDER — AMLODIPINE BESYLATE 5 MG PO TABS
5.0000 mg | ORAL_TABLET | Freq: Every day | ORAL | 1 refills | Status: DC
Start: 2022-12-12 — End: 2023-06-15

## 2022-12-12 MED ORDER — VITAMIN D (ERGOCALCIFEROL) 1.25 MG (50000 UNIT) PO CAPS
ORAL_CAPSULE | ORAL | 1 refills | Status: DC
Start: 2022-12-12 — End: 2023-08-31

## 2022-12-12 MED ORDER — DUTASTERIDE 0.5 MG PO CAPS
ORAL_CAPSULE | ORAL | 1 refills | Status: AC
Start: 2022-12-12 — End: ?

## 2022-12-12 MED ORDER — ATORVASTATIN CALCIUM 10 MG PO TABS
ORAL_TABLET | ORAL | 1 refills | Status: DC
Start: 2022-12-12 — End: 2023-06-15

## 2022-12-12 NOTE — Progress Notes (Signed)
Date:  12/12/2022   Name:  Victor Walsh   DOB:  [birth year]   MRN:  [id number]   Chief Complaint: Hypertension, Hyperlipidemia, and Vit D Deficiency  Hypertension This is a chronic problem. The problem has been gradually improving since onset. The problem is controlled. Pertinent negatives include no chest pain, headaches, neck pain, orthopnea, palpitations, PND or shortness of breath. There are no associated agents to hypertension. Risk factors for coronary artery disease include dyslipidemia. Past treatments include calcium channel blockers. The current treatment provides moderate improvement. There are no compliance problems.  There is no history of angina, CAD/MI or CVA. There is no history of chronic renal disease.  Hyperlipidemia This is a chronic problem. The current episode started more than 1 year ago. The problem is controlled. Recent lipid tests were reviewed and are normal. He has no history of chronic renal disease or diabetes. Pertinent negatives include no chest pain, myalgias or shortness of breath. The current treatment provides moderate improvement of lipids. There are no compliance problems.  There are no known risk factors for coronary artery disease.  Benign Prostatic Hypertrophy This is a chronic problem. The current episode started more than 1 year ago. The problem has been waxing and waning since onset. Irritative symptoms do not include frequency, nocturia or urgency. Obstructive symptoms do not include an intermittent stream. Pertinent negatives include no chills, dysuria, hematuria or nausea. Past treatments include dutasteride.    Lab Results  Component Value Date   NA 141 05/17/2022   K 4.9 05/17/2022   CO2 23 05/17/2022   GLUCOSE 98 05/17/2022   BUN 20 05/17/2022   CREATININE 1.42 (H) 05/17/2022   CALCIUM 9.4 05/17/2022   EGFR 49 (L) 05/17/2022   GFRNONAA 51 (L) 04/14/2020   Lab Results  Component Value Date   CHOL 172 05/17/2022   HDL 55 05/17/2022    LDLCALC 100 (H) 05/17/2022   TRIG 92 05/17/2022   CHOLHDL 4.2 01/01/2019   No results found for: "TSH" No results found for: "HGBA1C" Lab Results  Component Value Date   WBC 5.0 10/18/2017   HGB 14.9 10/18/2017   HCT 44.8 10/18/2017   MCV 99 (H) 10/18/2017   PLT 156 10/18/2017   Lab Results  Component Value Date   ALT 18 05/17/2022   AST 17 05/17/2022   ALKPHOS 56 05/17/2022   BILITOT 0.4 05/17/2022   No results found for: "25OHVITD2", "25OHVITD3", "VD25OH"   Review of Systems  Constitutional:  Negative for chills and fever.  HENT:  Negative for drooling, ear discharge, ear pain and sore throat.   Respiratory:  Negative for cough, shortness of breath and wheezing.   Cardiovascular:  Negative for chest pain, palpitations, orthopnea, leg swelling and PND.  Gastrointestinal:  Negative for abdominal pain, blood in stool, constipation, diarrhea and nausea.  Endocrine: Negative for polydipsia.  Genitourinary:  Negative for dysuria, frequency, hematuria, nocturia and urgency.  Musculoskeletal:  Negative for back pain, myalgias and neck pain.  Skin:  Negative for rash.  Allergic/Immunologic: Negative for environmental allergies.  Neurological:  Negative for dizziness and headaches.  Hematological:  Does not bruise/bleed easily.  Psychiatric/Behavioral:  Negative for suicidal ideas. The patient is not nervous/anxious.     Patient Active Problem List   Diagnosis Date Noted   Benign prostatic hyperplasia with weak urinary stream 07/02/2019   Chronic kidney disease (CKD), stage III (moderate) (HCC) 01/29/2019   Essential hypertension 02/09/2015   Hyperlipidemia type III 02/09/2015   Benign  prostatic hypertrophy 02/09/2015   Vitamin D deficiency 02/09/2015    No Known Allergies  Past Surgical History:  Procedure Laterality Date   COLONOSCOPY  2013   cleared- Dr Marva Panda   COLONOSCOPY N/A 08/22/2016   Procedure: COLONOSCOPY;  Surgeon: Christena Deem, MD;  Location: [facility]  ENDOSCOPY;  Service: Endoscopy;  Laterality: N/A;   COLONOSCOPY W/ POLYPECTOMY     COLONOSCOPY WITH PROPOFOL N/A 10/02/2022   Procedure: COLONOSCOPY WITH PROPOFOL;  Surgeon: Jaynie Collins, DO;  Location: [facility] ENDOSCOPY;  Service: Gastroenterology;  Laterality: N/A;   HERNIA REPAIR      Social History   Tobacco Use   Smoking status: Never   Smokeless tobacco: Never   Tobacco comments:    smoking cessation materials not required  Vaping Use   Vaping status: Never Used  Substance Use Topics   Alcohol use: No    Alcohol/week: 0.0 standard drinks of alcohol   Drug use: No     Medication list has been reviewed and updated.  Current Meds  Medication Sig   amLODipine (NORVASC) 5 MG tablet Take 1 tablet (5 mg total) by mouth daily.   aspirin EC 81 MG tablet Take by mouth.   atorvastatin (LIPITOR) 10 MG tablet TAKE 1 TABLET(10 MG) BY MOUTH DAILY   dutasteride (AVODART) 0.5 MG capsule TAKE 1 CAPSULE BY MOUTH DAILY   Vitamin D, Ergocalciferol, (DRISDOL) 1.25 MG (50000 UNIT) CAPS capsule Take 1 pill every 30 days       12/12/2022    8:55 AM 05/17/2022   11:06 AM 03/17/2022    2:26 PM 11/16/2021    8:32 AM  GAD 7 : Generalized Anxiety Score  Nervous, Anxious, on Edge 0 0 0 0  Control/stop worrying 0 0 0 0  Worry too much - different things 0 0 0 0  Trouble relaxing 0 0 0 0  Restless 0 0 0 0  Easily annoyed or irritable 0 0 0 0  Afraid - awful might happen 0 0 0 0  Total GAD 7 Score 0 0 0 0  Anxiety Difficulty Not difficult at all Not difficult at all Not difficult at all Not difficult at all       12/12/2022    8:55 AM 05/17/2022   11:06 AM 03/17/2022    2:25 PM  Depression screen PHQ 2/9  Decreased Interest 0 0 0  Down, Depressed, Hopeless 0 0 0  PHQ - 2 Score 0 0 0  Altered sleeping 0 0 0  Tired, decreased energy 0 0 0  Change in appetite 0 0 0  Feeling bad or failure about yourself  0 0 0  Trouble concentrating 0 0 0  Moving slowly or fidgety/restless 0 0 0   Suicidal thoughts 0 0 0  PHQ-9 Score 0 0 0  Difficult doing work/chores Not difficult at all Not difficult at all Not difficult at all    BP Readings from Last 3 Encounters:  12/12/22 138/78  10/02/22 (!) 143/93  05/17/22 128/74    Physical Exam Vitals and nursing note reviewed.  HENT:     Head: Normocephalic.     Right Ear: External ear normal.     Left Ear: External ear normal.     Nose: Nose normal.  Eyes:     General: No scleral icterus.       Right eye: No discharge.        Left eye: No discharge.     Conjunctiva/sclera: Conjunctivae normal.  Pupils: Pupils are equal, round, and reactive to light.  Neck:     Thyroid: No thyromegaly.     Vascular: No JVD.     Trachea: No tracheal deviation.  Cardiovascular:     Rate and Rhythm: Normal rate and regular rhythm.     Heart sounds: Normal heart sounds. No murmur heard.    No friction rub. No gallop.  Pulmonary:     Effort: No respiratory distress.     Breath sounds: Normal breath sounds. No wheezing or rales.  Abdominal:     General: Bowel sounds are normal.     Palpations: Abdomen is soft. There is no mass.     Tenderness: There is no abdominal tenderness. There is no guarding or rebound.  Musculoskeletal:        General: No tenderness. Normal range of motion.     Cervical back: Normal range of motion and neck supple.  Lymphadenopathy:     Cervical: No cervical adenopathy.  Skin:    General: Skin is warm.     Findings: No rash.  Neurological:     Mental Status: He is alert and oriented to person, place, and time.     Cranial Nerves: No cranial nerve deficit.     Deep Tendon Reflexes: Reflexes are normal and symmetric.     Wt Readings from Last 3 Encounters:  12/12/22 187 lb (84.8 kg)  10/02/22 182 lb 5.8 oz (82.7 kg)  05/17/22 190 lb (86.2 kg)    BP 138/78   Pulse 62   Ht 5\' 9"  (1.753 m)   Wt 187 lb (84.8 kg)   SpO2 94%   BMI 27.62 kg/m   Assessment and Plan:  1. Essential  hypertension Chronic.  Controlled.  Stable.  Blood pressure today is 138/78.  Asymptomatic.  Tolerating medication well.  Continue amlodipine 5 mg once a day.  Will check renal function panel for electrolytes and GFR. - amLODipine (NORVASC) 5 MG tablet; Take 1 tablet (5 mg total) by mouth daily.  Dispense: 90 tablet; Refill: 1 - Renal Function Panel  2. Hyperlipidemia type III Chronic.  Controlled.  Stable.  Continue atorvastatin 10 mg once a day.  Continue lipid panel for current surveillance of LDL. - atorvastatin (LIPITOR) 10 MG tablet; TAKE 1 TABLET(10 MG) BY MOUTH DAILY  Dispense: 90 tablet; Refill: 1 - Lipid Panel With LDL/HDL Ratio  3. Benign prostatic hyperplasia with weak urinary stream Chronic.  Controlled.  Stable.  Asymptomatic with adequate urination.  Continue Avodart 0.5 mg once a day. - dutasteride (AVODART) 0.5 MG capsule; TAKE 1 CAPSULE BY MOUTH DAILY  Dispense: 90 capsule; Refill: 1  4. Vitamin D deficiency Chronic.  Continue supplemental vitamin D 50,000 units every 30 days.  Will recheck in 6 months. - Vitamin D, Ergocalciferol, (DRISDOL) 1.25 MG (50000 UNIT) CAPS capsule; Take 1 pill every 30 days  Dispense: 3 capsule; Refill: 1    Elizabeth Sauer, MD

## 2022-12-13 ENCOUNTER — Other Ambulatory Visit: Payer: Self-pay

## 2022-12-13 DIAGNOSIS — R7989 Other specified abnormal findings of blood chemistry: Secondary | ICD-10-CM

## 2022-12-13 LAB — LIPID PANEL WITH LDL/HDL RATIO
Cholesterol, Total: 163 mg/dL (ref 100–199)
HDL: 54 mg/dL (ref >39)
LDL Chol Calc (NIH): 93 mg/dL (ref 0–99)
LDL/HDL Ratio: 1.7 ratio (ref 0.0–3.6)
Triglycerides: 83 mg/dL (ref 0–149)
VLDL Cholesterol Cal: 16 mg/dL (ref 5–40)

## 2022-12-13 LAB — RENAL FUNCTION PANEL
Albumin: 4 g/dL (ref 3.7–4.7)
BUN/Creatinine Ratio: 13 (ref 10–24)
BUN: 19 mg/dL (ref 8–27)
CO2: 23 mmol/L (ref 20–29)
Calcium: 9.2 mg/dL (ref 8.6–10.2)
Chloride: 106 mmol/L (ref 96–106)
Creatinine, Ser: 1.44 mg/dL — ABNORMAL HIGH (ref 0.76–1.27)
Glucose: 101 mg/dL — ABNORMAL HIGH (ref 70–99)
Phosphorus: 2.7 mg/dL — ABNORMAL LOW (ref 2.8–4.1)
Potassium: 4.4 mmol/L (ref 3.5–5.2)
Sodium: 141 mmol/L (ref 134–144)
eGFR: 48 mL/min/{1.73_m2} — ABNORMAL LOW (ref >59)

## 2022-12-13 NOTE — Progress Notes (Signed)
Ref placed neph

## 2023-01-04 DIAGNOSIS — N1831 Chronic kidney disease, stage 3a: Secondary | ICD-10-CM | POA: Diagnosis not present

## 2023-01-04 DIAGNOSIS — I10 Essential (primary) hypertension: Secondary | ICD-10-CM | POA: Diagnosis not present

## 2023-01-10 ENCOUNTER — Ambulatory Visit
Admission: RE | Admit: 2023-01-10 | Discharge: 2023-01-10 | Disposition: A | Discharge status: Home / Self Care | Payer: Medicare PPO | Source: Ambulatory Visit | Attending: Nephrology | Admitting: Nephrology

## 2023-01-10 ENCOUNTER — Other Ambulatory Visit: Payer: Self-pay | Admitting: Nephrology

## 2023-01-10 DIAGNOSIS — N1831 Chronic kidney disease, stage 3a: Secondary | ICD-10-CM | POA: Insufficient documentation

## 2023-01-10 DIAGNOSIS — N281 Cyst of kidney, acquired: Secondary | ICD-10-CM | POA: Diagnosis not present

## 2023-03-13 ENCOUNTER — Telehealth: Payer: Self-pay | Admitting: Family Medicine

## 2023-03-13 NOTE — Telephone Encounter (Signed)
Copied from CRM [id number]. Topic: Medicare AWV >> Mar 13, 2023  1:45 PM Payton Doughty wrote: Reason for CRM: [id number]/22/24 to r/s AWV due to [facility] not in office on Tues   Verlee Rossetti; Care Guide Ambulatory Clinical Support [place] [facility] Direct Dial: [phone]

## 2023-05-02 ENCOUNTER — Ambulatory Visit: Payer: Medicare PPO

## 2023-05-02 ENCOUNTER — Telehealth: Payer: Self-pay

## 2023-05-02 DIAGNOSIS — Z0000 Encounter for general adult medical examination without abnormal findings: Secondary | ICD-10-CM

## 2023-05-02 NOTE — Telephone Encounter (Signed)
Please schedule pt an appointment.  KP

## 2023-05-02 NOTE — Patient Instructions (Addendum)
Mr. Landau , Thank you for taking time to come for your Medicare Wellness Visit. I appreciate your ongoing commitment to your health goals. Please review the following plan we discussed and let me know if I can assist you in the future.   Referrals/Orders/Follow-Ups/Clinician Recommendations: NONE  This is a list of the screening recommended for you and due dates:  Health Maintenance  Topic Date Due   Zoster (Shingles) Vaccine (1 of 2) Never done   COVID-19 Vaccine (7 - 2023-24 season) 03/31/2023   Medicare Annual Wellness Visit  05/01/2024   DTaP/Tdap/Td vaccine (2 - Td or Tdap) 09/13/2025   Pneumonia Vaccine  Completed   Flu Shot  Completed   HPV Vaccine  Aged Out    Advanced directives: (ACP Link)Information on Advanced Care Planning can be found at [facility] [place] [place] Advance Health Care Directives Advance Health Care Directives ([email or web address]   Next Medicare Annual Wellness Visit scheduled for next year: Yes   05/07/24 @ 1:20 PM IN PERSON

## 2023-05-02 NOTE — Telephone Encounter (Signed)
Noted Dr. Yetta Barre is aware.  KP

## 2023-05-02 NOTE — Progress Notes (Signed)
Subjective:   Victor Walsh is a 87 y.o. male who presents for Medicare Annual/Subsequent preventive examination.  Visit Complete: Virtual I connected with  Victor Walsh on 05/02/23 by a audio enabled telemedicine application and verified that I am speaking with the correct person using two identifiers.  Patient Location: Home  Provider Location: Office/Clinic  I discussed the limitations of evaluation and management by telemedicine. The patient expressed understanding and agreed to proceed.  Vital Signs: Because this visit was a virtual/telehealth visit, some criteria may be missing or patient reported. Any vitals not documented were not able to be obtained and vitals that have been documented are patient reported.  Cardiac Risk Factors include: advanced age (>64men, >71 women);dyslipidemia;hypertension;male gender;sedentary lifestyle     Objective:    There were no vitals filed for this visit. There is no height or weight on file to calculate BMI.     05/02/2023    2:07 PM 10/02/2022    1:32 PM 03/22/2022    8:47 AM 02/21/2021    3:27 PM 01/14/2020    2:52 PM 01/01/2019    3:57 PM 09/26/2017    3:44 PM  Advanced Directives  Does Patient Have a Medical Advance Directive? No No No No No No No  Would patient like information on creating a medical advance directive? No - Patient declined   No - Patient declined No - Patient declined No - Patient declined Yes (MAU/Ambulatory/Procedural Areas - Information given)    Current Medications (verified) Outpatient Encounter Medications as of 05/02/2023  Medication Sig   amLODipine (NORVASC) 5 MG tablet Take 1 tablet (5 mg total) by mouth daily.   aspirin EC 81 MG tablet Take by mouth.   atorvastatin (LIPITOR) 10 MG tablet TAKE 1 TABLET(10 MG) BY MOUTH DAILY   dutasteride (AVODART) 0.5 MG capsule TAKE 1 CAPSULE BY MOUTH DAILY   Vitamin D, Ergocalciferol, (DRISDOL) 1.25 MG (50000 UNIT) CAPS capsule Take 1 pill every 30 days   No  facility-administered encounter medications on file as of 05/02/2023.    Allergies (verified) Patient has no known allergies.   History: Past Medical History:  Diagnosis Date   BPH (benign prostatic hyperplasia)    Chronic kidney disease    Hyperlipidemia    Hypertension    Stomach ulcer    Past Surgical History:  Procedure Laterality Date   COLONOSCOPY  2013   cleared- Dr Marva Panda   COLONOSCOPY N/A 08/22/2016   Procedure: COLONOSCOPY;  Surgeon: Christena Deem, MD;  Location: [facility] ENDOSCOPY;  Service: Endoscopy;  Laterality: N/A;   COLONOSCOPY W/ POLYPECTOMY     COLONOSCOPY WITH PROPOFOL N/A 10/02/2022   Procedure: COLONOSCOPY WITH PROPOFOL;  Surgeon: Jaynie Collins, DO;  Location: [facility] ENDOSCOPY;  Service: Gastroenterology;  Laterality: N/A;   HERNIA REPAIR     Family History  Problem Relation Age of Onset   Kidney disease Mother    Cancer Father        stomach cancer   Social History   Socioeconomic History   Marital status: Divorced    Spouse name: Not on file   Number of children: 1   Years of education: Not on file   Highest education level: Master's degree (e.g., MA, MS, MEng, MEd, MSW, MBA)  Occupational History    Employer: [employer]  Tobacco Use   Smoking status: Never   Smokeless tobacco: Never   Tobacco comments:    smoking cessation materials not required  Vaping Use  Vaping status: Never Used  Substance and Sexual Activity   Alcohol use: No    Alcohol/week: 0.0 standard drinks of alcohol   Drug use: No   Sexual activity: Never  Other Topics Concern   Not on file  Social History Narrative   Pt lives alone   Social Determinants of Health   Financial Resource Strain: Low Risk  (05/02/2023)   Overall Financial Resource Strain (CARDIA)    Difficulty of Paying Living Expenses: Not hard at all  Food Insecurity: No Food Insecurity (05/02/2023)   Hunger Vital Sign    Worried About Running Out of Food in the Last Year: Never  true    Ran Out of Food in the Last Year: Never true  Transportation Needs: No Transportation Needs (05/02/2023)   PRAPARE - Administrator, Civil Service (Medical): No    Lack of Transportation (Non-Medical): No  Physical Activity: Insufficiently Active (05/02/2023)   Exercise Vital Sign    Days of Exercise per Week: 3 days    Minutes of Exercise per Session: 20 min  Stress: No Stress Concern Present (05/02/2023)   [employer] of Occupational Health - Occupational Stress Questionnaire    Feeling of Stress : Not at all  Social Connections: Socially Isolated (05/02/2023)   Social Connection and Isolation Panel [NHANES]    Frequency of Communication with Friends and Family: Once a week    Frequency of Social Gatherings with Friends and Family: Never    Attends Religious Services: More than 4 times per year    Active Member of [employer] or Organizations: No    Attends Engineer, structural: Never    Marital Status: Divorced    Tobacco Counseling Counseling given: Not Answered Tobacco comments: smoking cessation materials not required   Clinical Intake:  Pre-visit preparation completed: Yes  Pain : No/denies pain     BMI - recorded: 27.6 Nutritional Status: BMI 25 -29 Overweight Nutritional Risks: None Diabetes: No  How often do you need to have someone help you when you read instructions, pamphlets, or other written materials from your doctor or pharmacy?: 1 - Never  Interpreter Needed?: No  Information entered by :: Kennedy Bucker, LPN   Activities of Daily Living    05/02/2023    2:07 PM  In your present state of health, do you have any difficulty performing the following activities:  Hearing? 0  Vision? 0  Difficulty concentrating or making decisions? 0  Walking or climbing stairs? 0  Dressing or bathing? 0  Doing errands, shopping? 0  Preparing Food and eating ? N  Using the Toilet? N  In the past six months, have you accidently leaked  urine? N  Do you have problems with loss of bowel control? N  Managing your Medications? N  Managing your Finances? N  Housekeeping or managing your Housekeeping? N    Patient Care Team: Duanne Limerick, MD as PCP - General (Family Medicine) Christena Deem, MD (Inactive) as Consulting Physician (Gastroenterology) Verne Carrow, OD (Optometry)  Indicate any recent Medical Services you may have received from other than [facility] providers in the past year (date may be approximate).     Assessment:   This is a routine wellness examination for Blue.  Hearing/Vision screen Hearing Screening - Comments:: NO AIDS Vision Screening - Comments:: WEARS GLASSES- DR.SHADE   Goals Addressed             This Visit's Progress    DIET - EAT MORE  FRUITS AND VEGETABLES         Depression Screen    05/02/2023    2:05 PM 12/12/2022    8:55 AM 05/17/2022   11:06 AM 03/17/2022    2:25 PM 11/16/2021    8:32 AM 02/21/2021    3:24 PM 10/05/2020    2:16 PM  PHQ 2/9 Scores  PHQ - 2 Score 0 0 0 0 0 0 0  PHQ- 9 Score 0 0 0 0 0  0    Fall Risk    05/02/2023    2:07 PM 12/12/2022    8:55 AM 05/17/2022   11:06 AM 03/17/2022    2:20 PM 02/21/2021    3:30 PM  Fall Risk   Falls in the past year? 0 0 0 0 0  Number falls in past yr: 0 0 0 0 0  Injury with Fall? 0 0 0 0 0  Risk for fall due to : No Fall Risks No Fall Risks No Fall Risks No Fall Risks No Fall Risks  Follow up Falls prevention discussed;Falls evaluation completed Falls evaluation completed Falls evaluation completed Falls evaluation completed Falls prevention discussed    MEDICARE RISK AT HOME: Medicare Risk at Home Any stairs in or around the home?: Yes If so, are there any without handrails?: No Home free of loose throw rugs in walkways, pet beds, electrical cords, etc?: Yes Adequate lighting in your home to reduce risk of falls?: Yes Life alert?: No Use of a cane, walker or w/c?: No Grab bars in the bathroom?: Yes Shower  chair or bench in shower?: No Elevated toilet seat or a handicapped toilet?: No  TIMED UP AND GO:  Was the test performed?  No    Cognitive Function:        05/02/2023    2:09 PM 03/17/2022    2:29 PM 04/14/2020    9:20 AM 01/14/2020    2:59 PM 01/01/2019    4:04 PM  6CIT Screen  What Year? 0 points 0 points 0 points 0 points 0 points  What month? 0 points 0 points 0 points 0 points 0 points  What time? 0 points 0 points 0 points 0 points 0 points  Count back from 20 0 points 0 points 0 points 0 points 0 points  Months in reverse 0 points 0 points 0 points 0 points 0 points  Repeat phrase 0 points 0 points 0 points 0 points 0 points  Total Score 0 points 0 points 0 points 0 points 0 points    Immunizations Immunization History  Administered Date(s) Administered   Influenza Split 02/09/2015   Influenza, High Dose Seasonal PF 02/21/2017, 02/04/2018, 02/22/2022   Influenza,inj,Quad PF,6+ Mos 02/09/2015   Influenza-Unspecified 01/21/2020, 01/24/2021, 02/03/2023   Moderna SARS-COV2 Booster Vaccination 10/13/2020   PFIZER(Purple Top)SARS-COV-2 Vaccination 06/26/2019, 07/17/2019, 03/23/2020   PNEUMOCOCCAL CONJUGATE-20 10/05/2020   Pfizer Covid-19 Vaccine Bivalent Booster 5y-11y 02/03/2023   Pfizer(Comirnaty)Fall Seasonal Vaccine 12 years and older 02/24/2022   Pneumococcal Conjugate-13 02/09/2015   Pneumococcal Polysaccharide-23 05/22/2006   Tdap 09/14/2015    TDAP status: Up to date  Flu Vaccine status: Up to date  Pneumococcal vaccine status: Up to date  Covid-19 vaccine status: Completed vaccines  Qualifies for Shingles Vaccine? Yes   Zostavax completed No   Shingrix Completed?: No.    Education has been provided regarding the importance of this vaccine. Patient has been advised to call insurance company to determine out of pocket expense if they have not yet  received this vaccine. Advised may also receive vaccine at local pharmacy or Health Dept. Verbalized acceptance  and understanding.  Screening Tests Health Maintenance  Topic Date Due   Zoster Vaccines- Shingrix (1 of 2) Never done   COVID-19 Vaccine (7 - 2023-24 season) 03/31/2023   Medicare Annual Wellness (AWV)  05/01/2024   DTaP/Tdap/Td (2 - Td or Tdap) 09/13/2025   Pneumonia Vaccine 36+ Years old  Completed   INFLUENZA VACCINE  Completed   HPV VACCINES  Aged Out    Health Maintenance  Health Maintenance Due  Topic Date Due   Zoster Vaccines- Shingrix (1 of 2) Never done   COVID-19 Vaccine (7 - 2023-24 season) 03/31/2023    Colorectal cancer screening: No longer required.   Lung Cancer Screening: (Low Dose CT Chest recommended if Age 29-80 years, 20 pack-year currently smoking OR have quit w/in 15years.) does not qualify.    Additional Screening:  Hepatitis C Screening: does not qualify; Completed NO  Vision Screening: Recommended annual ophthalmology exams for early detection of glaucoma and other disorders of the eye. Is the patient up to date with their annual eye exam?  Yes  Who is the provider or what is the name of the office in which the patient attends annual eye exams? DR.SHADE If pt is not established with a provider, would they like to be referred to a provider to establish care? No .   Dental Screening: Recommended annual dental exams for proper oral hygiene   Community Resource Referral / Chronic Care Management: CRR required this visit?  No   CCM required this visit?  No     Plan:     I have personally reviewed and noted the following in the patient's chart:   Medical and social history Use of alcohol, tobacco or illicit drugs  Current medications and supplements including opioid prescriptions. Patient is not currently taking opioid prescriptions. Functional ability and status Nutritional status Physical activity Advanced directives List of other physicians Hospitalizations, surgeries, and ER visits in previous 12 months Vitals Screenings to include  cognitive, depression, and falls Referrals and appointments  In addition, I have reviewed and discussed with patient certain preventive protocols, quality metrics, and best practice recommendations. A written personalized care plan for preventive services as well as general preventive health recommendations were provided to patient.     Jaquavian Hope, LPN   [id number]   After Visit Summary: (MyChart) Due to this being a telephonic visit, the after visit summary with patients personalized plan was offered to patient via MyChart   Nurse Notes: NONE

## 2023-05-02 NOTE — Telephone Encounter (Signed)
-----   Message from Elizabeth Sauer sent at 05/02/2023  3:47 PM EST ----- Regarding: FW: NIGHTTIME URINATING  ----- Message ----- From: Gayle Hope, LPN Sent: [id number]   2:18 PM EST To: Duanne Limerick, MD Subject: NIGHTTIME URINATING                            Hi Dr.Jones, I did his AWV today and he asked if you think he might need a med for his nighttime urinating. He goes about 2 times per might, and he wasn't sure if that is normal at his age. Thanks!

## 2023-05-18 ENCOUNTER — Ambulatory Visit: Payer: Medicare PPO | Admitting: Family Medicine

## 2023-06-15 ENCOUNTER — Ambulatory Visit: Payer: Medicare PPO | Admitting: Family Medicine

## 2023-06-15 ENCOUNTER — Encounter: Payer: Self-pay | Admitting: Family Medicine

## 2023-06-15 VITALS — BP 128/72 | HR 80 | Ht 69.0 in | Wt 196.0 lb

## 2023-06-15 DIAGNOSIS — E782 Mixed hyperlipidemia: Secondary | ICD-10-CM | POA: Diagnosis not present

## 2023-06-15 DIAGNOSIS — I10 Essential (primary) hypertension: Secondary | ICD-10-CM

## 2023-06-15 MED ORDER — AMLODIPINE BESYLATE 5 MG PO TABS
5.0000 mg | ORAL_TABLET | Freq: Every day | ORAL | 1 refills | Status: AC
Start: 2023-06-15 — End: ?

## 2023-06-15 MED ORDER — ATORVASTATIN CALCIUM 10 MG PO TABS
ORAL_TABLET | ORAL | 1 refills | Status: AC
Start: 2023-06-15 — End: ?

## 2023-06-15 NOTE — Progress Notes (Signed)
Date:  06/15/2023   Name:  Victor Walsh   DOB:  1935-09-21   MRN:  [id number]   Chief Complaint: Hypertension and Hyperlipidemia  Hypertension This is a chronic problem. The current episode started more than 1 year ago. The problem is controlled. Pertinent negatives include no anxiety, blurred vision, chest pain, headaches, malaise/fatigue, neck pain, orthopnea, palpitations, peripheral edema, PND, shortness of breath or sweats. There are no associated agents to hypertension. There are no known risk factors for coronary artery disease. Past treatments include calcium channel blockers. The current treatment provides moderate improvement. There are no compliance problems.  There is no history of angina, CAD/MI or CVA. There is no history of chronic renal disease, a hypertension causing med or renovascular disease.  Hyperlipidemia This is a chronic problem. The problem is controlled. Recent lipid tests were reviewed and are normal. He has no history of chronic renal disease. There are no known factors aggravating his hyperlipidemia. Pertinent negatives include no chest pain or shortness of breath. Current antihyperlipidemic treatment includes statins. Compliance problems include adherence to exercise.  Risk factors for coronary artery disease include dyslipidemia.    Lab Results  Component Value Date   NA 141 12/12/2022   K 4.4 12/12/2022   CO2 23 12/12/2022   GLUCOSE 101 (H) 12/12/2022   BUN 19 12/12/2022   CREATININE 1.44 (H) 12/12/2022   CALCIUM 9.2 12/12/2022   EGFR 48 (L) 12/12/2022   GFRNONAA 51 (L) 04/14/2020   Lab Results  Component Value Date   CHOL 163 12/12/2022   HDL 54 12/12/2022   LDLCALC 93 12/12/2022   TRIG 83 12/12/2022   CHOLHDL 4.2 01/01/2019   No results found for: "TSH" No results found for: "HGBA1C" Lab Results  Component Value Date   WBC 5.0 10/18/2017   HGB 14.9 10/18/2017   HCT 44.8 10/18/2017   MCV 99 (H) 10/18/2017   PLT 156 10/18/2017   Lab  Results  Component Value Date   ALT 18 05/17/2022   AST 17 05/17/2022   ALKPHOS 56 05/17/2022   BILITOT 0.4 05/17/2022   No results found for: "25OHVITD2", "25OHVITD3", "VD25OH"   Review of Systems  Constitutional:  Negative for malaise/fatigue.  HENT:  Negative for congestion.   Eyes:  Negative for blurred vision and visual disturbance.  Respiratory:  Negative for apnea, cough, choking, chest tightness, shortness of breath, wheezing and stridor.   Cardiovascular:  Negative for chest pain, palpitations, orthopnea, leg swelling and PND.  Gastrointestinal:  Negative for abdominal distention, abdominal pain and blood in stool.  Endocrine: Negative for polydipsia and polyuria.  Genitourinary:  Negative for hematuria.  Musculoskeletal:  Negative for neck pain.  Neurological:  Negative for headaches.    Patient Active Problem List   Diagnosis Date Noted   Benign prostatic hyperplasia with weak urinary stream 07/02/2019   Chronic kidney disease (CKD), stage III (moderate) (HCC) 01/29/2019   Essential hypertension 02/09/2015   Hyperlipidemia type III 02/09/2015   Benign prostatic hyperplasia 02/09/2015   Vitamin D deficiency 02/09/2015    No Known Allergies  Past Surgical History:  Procedure Laterality Date   COLONOSCOPY  2013   cleared- Dr Marva Panda   COLONOSCOPY N/A 08/22/2016   Procedure: COLONOSCOPY;  Surgeon: Christena Deem, MD;  Location[facility] ENDOSCOPY;  Service: Endoscopy;  Laterality: N/A;   COLONOSCOPY W/ POLYPECTOMY     COLONOSCOPY WITH PROPOFOL N/A 10/02/2022   Procedure: COLONOSCOPY WITH PROPOFOL;  Surgeon: Jaynie Collins, DO;  Location: [facility]  ENDOSCOPY;  Service: Gastroenterology;  Laterality: N/A;   HERNIA REPAIR      Social History   Tobacco Use   Smoking status: Never   Smokeless tobacco: Never   Tobacco comments:    smoking cessation materials not required  Vaping Use   Vaping status: Never Used  Substance Use Topics   Alcohol use: No     Alcohol/week: 0.0 standard drinks of alcohol   Drug use: No     Medication list has been reviewed and updated.  Current Meds  Medication Sig   amLODipine (NORVASC) 5 MG tablet Take 1 tablet (5 mg total) by mouth daily.   aspirin EC 81 MG tablet Take by mouth.   atorvastatin (LIPITOR) 10 MG tablet TAKE 1 TABLET(10 MG) BY MOUTH DAILY   dutasteride (AVODART) 0.5 MG capsule TAKE 1 CAPSULE BY MOUTH DAILY   losartan (COZAAR) 25 MG tablet Take 25 mg by mouth daily.   Vitamin D, Ergocalciferol, (DRISDOL) 1.25 MG (50000 UNIT) CAPS capsule Take 1 pill every 30 days       06/15/2023    9:21 AM 12/12/2022    8:55 AM 05/17/2022   11:06 AM 03/17/2022    2:26 PM  GAD 7 : Generalized Anxiety Score  Nervous, Anxious, on Edge 0 0 0 0  Control/stop worrying 0 0 0 0  Worry too much - different things 0 0 0 0  Trouble relaxing 0 0 0 0  Restless 0 0 0 0  Easily annoyed or irritable 0 0 0 0  Afraid - awful might happen 0 0 0 0  Total GAD 7 Score 0 0 0 0  Anxiety Difficulty Not difficult at all Not difficult at all Not difficult at all Not difficult at all       06/15/2023    9:21 AM 05/02/2023    2:05 PM 12/12/2022    8:55 AM  Depression screen PHQ 2/9  Decreased Interest 0 0 0  Down, Depressed, Hopeless 0 0 0  PHQ - 2 Score 0 0 0  Altered sleeping 0 0 0  Tired, decreased energy 0 0 0  Change in appetite 0 0 0  Feeling bad or failure about yourself  0 0 0  Trouble concentrating 0 0 0  Moving slowly or fidgety/restless 0 0 0  Suicidal thoughts 0 0 0  PHQ-9 Score 0 0 0  Difficult doing work/chores Not difficult at all Not difficult at all Not difficult at all    BP Readings from Last 3 Encounters:  06/15/23 128/72  12/12/22 138/78  10/02/22 (!) 143/93    Physical Exam Vitals and nursing note reviewed.  Constitutional:      Appearance: He is well-developed.  HENT:     Head: Normocephalic and atraumatic.     Right Ear: Tympanic membrane, ear canal and external ear normal.      Left Ear: Tympanic membrane, ear canal and external ear normal.     Nose: Nose normal.     Mouth/Throat:     Mouth: Mucous membranes are moist.     Dentition: Normal dentition.  Eyes:     General: Lids are normal. No scleral icterus.    Conjunctiva/sclera: Conjunctivae normal.     Pupils: Pupils are equal, round, and reactive to light.  Neck:     Thyroid: No thyromegaly.     Vascular: No carotid bruit, hepatojugular reflux or JVD.     Trachea: No tracheal deviation.  Cardiovascular:     Rate and  Rhythm: Normal rate and regular rhythm.     Heart sounds: Normal heart sounds. No murmur heard.    No gallop.  Pulmonary:     Effort: Pulmonary effort is normal.     Breath sounds: Normal breath sounds. No wheezing, rhonchi or rales.  Abdominal:     General: Bowel sounds are normal.     Palpations: Abdomen is soft. There is no hepatomegaly, splenomegaly or mass.     Tenderness: There is no abdominal tenderness. There is no guarding or rebound.     Hernia: There is no hernia in the left inguinal area.  Musculoskeletal:        General: Normal range of motion.     Cervical back: Normal range of motion and neck supple.  Lymphadenopathy:     Cervical: No cervical adenopathy.  Skin:    General: Skin is warm and dry.     Capillary Refill: Capillary refill takes less than 2 seconds.     Findings: No rash.  Neurological:     Mental Status: He is alert and oriented to person, place, and time.     Sensory: No sensory deficit.     Deep Tendon Reflexes: Reflexes are normal and symmetric.  Psychiatric:        Mood and Affect: Mood is not anxious or depressed.     Wt Readings from Last 3 Encounters:  06/15/23 196 lb (88.9 kg)  12/12/22 187 lb (84.8 kg)  10/02/22 182 lb 5.8 oz (82.7 kg)    BP 128/72   Pulse 80   Ht 5\' 9"  (1.753 m)   Wt 196 lb (88.9 kg)   SpO2 98%   BMI 28.94 kg/m   Assessment and Plan: 1. Essential hypertension (Primary) Chronic.  Controlled.  Stable.  Asymptomatic.   Tolerating medication well.  Blood pressure today is 128/72.  Will continue amlodipine 5 mg once a day.  And will check CMP for electrolytes and GFR.  Will recheck patient in 6 months. - amLODipine (NORVASC) 5 MG tablet; Take 1 tablet (5 mg total) by mouth daily.  Dispense: 90 tablet; Refill: 1 - Comprehensive metabolic panel  2. Hyperlipidemia type III Chronic.  Controlled.  Stable.  Asymptomatic.  Tolerating atorvastatin 10 mg once a day.  Patient is asymptomatic without myalgias or muscle weakness.  Will check lipid panel for current level of control.  Will recheck patient in 6 months. - atorvastatin (LIPITOR) 10 MG tablet; TAKE 1 TABLET(10 MG) BY MOUTH DAILY  Dispense: 90 tablet; Refill: 1 - Lipid Panel With LDL/HDL Ratio     Elizabeth Sauer, MD

## 2023-06-16 LAB — COMPREHENSIVE METABOLIC PANEL
ALT: 19 [IU]/L (ref 0–44)
AST: 21 [IU]/L (ref 0–40)
Albumin: 4.3 g/dL (ref 3.7–4.7)
Alkaline Phosphatase: 61 [IU]/L (ref 44–121)
BUN/Creatinine Ratio: 16 (ref 10–24)
BUN: 27 mg/dL (ref 8–27)
Bilirubin Total: 0.4 mg/dL (ref 0.0–1.2)
CO2: 22 mmol/L (ref 20–29)
Calcium: 9.4 mg/dL (ref 8.6–10.2)
Chloride: 105 mmol/L (ref 96–106)
Creatinine, Ser: 1.65 mg/dL — ABNORMAL HIGH (ref 0.76–1.27)
Globulin, Total: 2.6 g/dL (ref 1.5–4.5)
Glucose: 76 mg/dL (ref 70–99)
Potassium: 4.4 mmol/L (ref 3.5–5.2)
Sodium: 142 mmol/L (ref 134–144)
Total Protein: 6.9 g/dL (ref 6.0–8.5)
eGFR: 40 mL/min/{1.73_m2} — ABNORMAL LOW (ref >59)

## 2023-06-16 LAB — LIPID PANEL WITH LDL/HDL RATIO
Cholesterol, Total: 181 mg/dL (ref 100–199)
HDL: 58 mg/dL (ref >39)
LDL Chol Calc (NIH): 104 mg/dL — ABNORMAL HIGH (ref 0–99)
LDL/HDL Ratio: 1.8 {ratio} (ref 0.0–3.6)
Triglycerides: 104 mg/dL (ref 0–149)
VLDL Cholesterol Cal: 19 mg/dL (ref 5–40)

## 2023-06-25 ENCOUNTER — Telehealth: Payer: Self-pay | Admitting: Family Medicine

## 2023-06-25 NOTE — Telephone Encounter (Signed)
Victor Walsh came into the office stating that usually someone calls him about his labs. I explained that it could be a chance that we do not have them yet. Pt mentioned just leaving the info on his voicemail if you do not reach him. fyi

## 2023-06-25 NOTE — Telephone Encounter (Signed)
Called and left patient VM to inform of lab results. Per Dr Yetta Barre, Cholesterol is acceptable. Kidney function is slightly decreased from last time. Drink plenty of fluids and follow up with nephrology.  - Tanveer Brammer

## 2023-06-28 DIAGNOSIS — I10 Essential (primary) hypertension: Secondary | ICD-10-CM | POA: Diagnosis not present

## 2023-06-28 DIAGNOSIS — N1832 Chronic kidney disease, stage 3b: Secondary | ICD-10-CM | POA: Diagnosis not present

## 2023-06-28 DIAGNOSIS — N1831 Chronic kidney disease, stage 3a: Secondary | ICD-10-CM | POA: Diagnosis not present

## 2023-08-31 ENCOUNTER — Other Ambulatory Visit: Payer: Self-pay

## 2023-08-31 DIAGNOSIS — E559 Vitamin D deficiency, unspecified: Secondary | ICD-10-CM

## 2023-08-31 MED ORDER — VITAMIN D (ERGOCALCIFEROL) 1.25 MG (50000 UNIT) PO CAPS: ORAL_CAPSULE | ORAL | 1 refills | Status: DC

## 2023-10-12 ENCOUNTER — Ambulatory Visit: Payer: Self-pay | Admitting: Family Medicine

## 2023-10-12 ENCOUNTER — Encounter: Payer: Self-pay | Admitting: Family Medicine

## 2023-10-12 VITALS — BP 138/84 | HR 67 | Resp 16 | Ht 69.0 in | Wt 196.3 lb

## 2023-10-12 DIAGNOSIS — R3912 Poor urinary stream: Secondary | ICD-10-CM

## 2023-10-12 DIAGNOSIS — E782 Mixed hyperlipidemia: Secondary | ICD-10-CM | POA: Diagnosis not present

## 2023-10-12 DIAGNOSIS — N401 Enlarged prostate with lower urinary tract symptoms: Secondary | ICD-10-CM

## 2023-10-12 DIAGNOSIS — I10 Essential (primary) hypertension: Secondary | ICD-10-CM

## 2023-10-12 NOTE — Progress Notes (Signed)
 Date:  10/12/2023   Name:  Victor Walsh   DOB:  [birth year]   MRN:  [id number]   Chief Complaint: Hypertension, low vitamin d , Hyperlipidemia, and Benign Prostatic Hypertrophy  Hypertension This is a chronic problem. The current episode started more than 1 year ago. The problem has been gradually improving since onset. The problem is controlled. Pertinent negatives include no anxiety, blurred vision, chest pain, headaches, malaise/fatigue, neck pain, orthopnea, palpitations, peripheral edema, PND, shortness of breath or sweats. There are no associated agents to hypertension. There are no known risk factors for coronary artery disease. Past treatments include angiotensin blockers and calcium  channel blockers. The current treatment provides moderate improvement. There are no compliance problems.  There is no history of angina, kidney disease, CAD/MI, CVA, heart failure, left ventricular hypertrophy, PVD or retinopathy. There is no history of chronic renal disease, a hypertension causing med or renovascular disease.  Hyperlipidemia This is a chronic problem. The current episode started more than 1 year ago. He has no history of chronic renal disease, diabetes, hypothyroidism, liver disease, obesity or nephrotic syndrome. There are no known factors aggravating his hyperlipidemia. Pertinent negatives include no chest pain or shortness of breath. Current antihyperlipidemic treatment includes statins. The current treatment provides moderate improvement of lipids. There are no compliance problems.  Risk factors for coronary artery disease include dyslipidemia and hypertension.  Benign Prostatic Hypertrophy This is a chronic problem. The current episode started more than 1 year ago. The problem has been gradually improving since onset. Irritative symptoms do not include frequency, nocturia or urgency. Obstructive symptoms do not include incomplete emptying, an intermittent stream or straining. Pertinent  negatives include no hematuria. Past treatments include dutasteride . The treatment provided moderate relief.    Lab Results  Component Value Date   NA 142 06/15/2023   K 4.4 06/15/2023   CO2 22 06/15/2023   GLUCOSE 76 06/15/2023   BUN 27 06/15/2023   CREATININE 1.65 (H) 06/15/2023   CALCIUM  9.4 06/15/2023   EGFR 40 (L) 06/15/2023   GFRNONAA 51 (L) 04/14/2020   Lab Results  Component Value Date   CHOL 181 06/15/2023   HDL 58 06/15/2023   LDLCALC 104 (H) 06/15/2023   TRIG 104 06/15/2023   CHOLHDL 4.2 01/01/2019   No results found for: "TSH" No results found for: "HGBA1C" Lab Results  Component Value Date   WBC 5.0 10/18/2017   HGB 14.9 10/18/2017   HCT 44.8 10/18/2017   MCV 99 (H) 10/18/2017   PLT 156 10/18/2017   Lab Results  Component Value Date   ALT 19 06/15/2023   AST 21 06/15/2023   ALKPHOS 61 06/15/2023   BILITOT 0.4 06/15/2023   No results found for: "25OHVITD2", "25OHVITD3", "VD25OH"   Review of Systems  Constitutional:  Negative for malaise/fatigue.  Eyes:  Negative for blurred vision.  Respiratory:  Negative for shortness of breath.   Cardiovascular:  Negative for chest pain, palpitations, orthopnea and PND.  Genitourinary:  Negative for frequency, hematuria, incomplete emptying, nocturia and urgency.  Musculoskeletal:  Negative for neck pain.  Neurological:  Negative for headaches.    Patient Active Problem List   Diagnosis Date Noted   Benign prostatic hyperplasia with weak urinary stream 07/02/2019   Chronic kidney disease (CKD), stage III (moderate) (HCC) 01/29/2019   Essential hypertension 02/09/2015   Hyperlipidemia type III 02/09/2015   Benign prostatic hyperplasia 02/09/2015   Vitamin D  deficiency 02/09/2015    No Known Allergies  Past Surgical History:  Procedure Laterality Date   COLONOSCOPY  2013   cleared- Dr Peg Bouton   COLONOSCOPY N/A 08/22/2016   Procedure: COLONOSCOPY;  Surgeon: Deveron Fly, MD;  [facility]  ENDOSCOPY;  Service: Endoscopy;  Laterality: N/A;   COLONOSCOPY W/ POLYPECTOMY     COLONOSCOPY WITH PROPOFOL  N/A 10/02/2022   Procedure: COLONOSCOPY WITH PROPOFOL ;  Surgeon: Quintin Buckle, DO;  Location: [facility] ENDOSCOPY;  Service: Gastroenterology;  Laterality: N/A;   HERNIA REPAIR      Social History   Tobacco Use   Smoking status: Never   Smokeless tobacco: Never   Tobacco comments:    smoking cessation materials not required  Vaping Use   Vaping status: Never Used  Substance Use Topics   Alcohol use: No    Alcohol/week: 0.0 standard drinks of alcohol   Drug use: No     Medication list has been reviewed and updated.  Current Meds  Medication Sig   amLODipine  (NORVASC ) 5 MG tablet Take 1 tablet (5 mg total) by mouth daily.   aspirin EC 81 MG tablet Take by mouth.   atorvastatin  (LIPITOR) 10 MG tablet TAKE 1 TABLET(10 MG) BY MOUTH DAILY   dutasteride  (AVODART ) 0.5 MG capsule TAKE 1 CAPSULE BY MOUTH DAILY   losartan (COZAAR) 25 MG tablet Take 25 mg by mouth daily.   Vitamin D , Ergocalciferol , (DRISDOL ) 1.25 MG (50000 UNIT) CAPS capsule Take 1 pill every 30 days       06/15/2023    9:21 AM 12/12/2022    8:55 AM 05/17/2022   11:06 AM 03/17/2022    2:26 PM  GAD 7 : Generalized Anxiety Score  Nervous, Anxious, on Edge 0 0 0 0  Control/stop worrying 0 0 0 0  Worry too much - different things 0 0 0 0  Trouble relaxing 0 0 0 0  Restless 0 0 0 0  Easily annoyed or irritable 0 0 0 0  Afraid - awful might happen 0 0 0 0  Total GAD 7 Score 0 0 0 0  Anxiety Difficulty Not difficult at all Not difficult at all Not difficult at all Not difficult at all       06/15/2023    9:21 AM 05/02/2023    2:05 PM 12/12/2022    8:55 AM  Depression screen PHQ 2/9  Decreased Interest 0 0 0  Down, Depressed, Hopeless 0 0 0  PHQ - 2 Score 0 0 0  Altered sleeping 0 0 0  Tired, decreased energy 0 0 0  Change in appetite 0 0 0  Feeling bad or failure about yourself  0 0 0  Trouble  concentrating 0 0 0  Moving slowly or fidgety/restless 0 0 0  Suicidal thoughts 0 0 0  PHQ-9 Score 0 0 0  Difficult doing work/chores Not difficult at all Not difficult at all Not difficult at all    BP Readings from Last 3 Encounters:  10/12/23 138/84  06/15/23 128/72  12/12/22 138/78    Physical Exam Vitals and nursing note reviewed.  Constitutional:      Appearance: He is well-developed.  HENT:     Head: Normocephalic and atraumatic.     Right Ear: Tympanic membrane, ear canal and external ear normal.     Left Ear: Tympanic membrane, ear canal and external ear normal.     Nose: Nose normal.     Mouth/Throat:     Mouth: Mucous membranes are moist.     Dentition: Normal dentition.  Eyes:  General: Lids are normal. No scleral icterus.    Conjunctiva/sclera: Conjunctivae normal.     Pupils: Pupils are equal, round, and reactive to light.  Neck:     Thyroid: No thyromegaly.     Vascular: No carotid bruit, hepatojugular reflux or JVD.     Trachea: No tracheal deviation.  Cardiovascular:     Rate and Rhythm: Normal rate and regular rhythm.     Heart sounds: Normal heart sounds.  Pulmonary:     Effort: Pulmonary effort is normal.     Breath sounds: Normal breath sounds. No wheezing, rhonchi or rales.  Abdominal:     General: Bowel sounds are normal.     Palpations: Abdomen is soft. There is no hepatomegaly, splenomegaly or mass.     Tenderness: There is no abdominal tenderness.     Hernia: There is no hernia in the left inguinal area.  Genitourinary:    Prostate: Not enlarged, not tender and no nodules present.     Rectum: Normal. Guaiac result negative. No mass.  Musculoskeletal:        General: Normal range of motion.     Cervical back: Normal range of motion and neck supple.  Lymphadenopathy:     Cervical: No cervical adenopathy.  Skin:    General: Skin is warm and dry.     Findings: No rash.  Neurological:     Mental Status: He is alert and oriented to  person, place, and time.     Sensory: No sensory deficit.     Deep Tendon Reflexes: Reflexes are normal and symmetric.  Psychiatric:        Mood and Affect: Mood is not anxious or depressed.     Wt Readings from Last 3 Encounters:  10/12/23 196 lb 4.8 oz (89 kg)  06/15/23 196 lb (88.9 kg)  12/12/22 187 lb (84.8 kg)    BP 138/84   Pulse 67   Resp 16   Ht 5\' 9"  (1.753 m)   Wt 196 lb 4.8 oz (89 kg)   SpO2 95%   BMI 28.99 kg/m   Assessment and Plan:  1. Essential hypertension (Primary) Chronic.  Controlled.  Stable.  Blood pressure 138/84.  Continue amlodipine  5 mg once a day and losartan 25 mg once a day will recheck in 6 months.  Will check CMP for electrolytes and GFR. - Comprehensive metabolic panel with GFR  2. Hyperlipidemia type III Chronic.  Controlled.  Stable.  Currently on atorvastatin  10 mg once a day.  Will continue with current level of medication upon review of LDL is acceptable. - Lipid Panel With LDL/HDL Ratio  3. Benign prostatic hyperplasia with weak urinary stream Chronic.  Controlled.  Stable.  Continue on Avodart  0.5 mg once a day.  DRE was normal with prostate that was assessed as normal size shape and consistency without nodularity.  Will check PSA for current level of control. - PSA    Alayne Allis, MD

## 2023-10-13 LAB — LIPID PANEL WITH LDL/HDL RATIO
Cholesterol, Total: 188 mg/dL (ref 100–199)
HDL: 60 mg/dL (ref >39)
LDL Chol Calc (NIH): 113 mg/dL — ABNORMAL HIGH (ref 0–99)
LDL/HDL Ratio: 1.9 ratio (ref 0.0–3.6)
Triglycerides: 82 mg/dL (ref 0–149)
VLDL Cholesterol Cal: 15 mg/dL (ref 5–40)

## 2023-10-13 LAB — COMPREHENSIVE METABOLIC PANEL WITH GFR
ALT: 22 IU/L (ref 0–44)
AST: 24 IU/L (ref 0–40)
Albumin: 4.4 g/dL (ref 3.7–4.7)
Alkaline Phosphatase: 60 IU/L (ref 44–121)
BUN/Creatinine Ratio: 13 (ref 10–24)
BUN: 19 mg/dL (ref 8–27)
Bilirubin Total: 0.4 mg/dL (ref 0.0–1.2)
CO2: 25 mmol/L (ref 20–29)
Calcium: 9.8 mg/dL (ref 8.6–10.2)
Chloride: 103 mmol/L (ref 96–106)
Creatinine, Ser: 1.47 mg/dL — ABNORMAL HIGH (ref 0.76–1.27)
Globulin, Total: 2.8 g/dL (ref 1.5–4.5)
Glucose: 96 mg/dL (ref 70–99)
Potassium: 4.7 mmol/L (ref 3.5–5.2)
Sodium: 140 mmol/L (ref 134–144)
Total Protein: 7.2 g/dL (ref 6.0–8.5)
eGFR: 46 mL/min/{1.73_m2} — ABNORMAL LOW (ref >59)

## 2023-10-13 LAB — PSA: Prostate Specific Ag, Serum: 1.6 ng/mL (ref 0.0–4.0)

## 2023-10-14 ENCOUNTER — Ambulatory Visit: Payer: Self-pay | Admitting: Family Medicine

## 2024-02-28 ENCOUNTER — Other Ambulatory Visit: Payer: Self-pay | Admitting: Student

## 2024-02-28 DIAGNOSIS — E559 Vitamin D deficiency, unspecified: Secondary | ICD-10-CM

## 2024-02-28 NOTE — Telephone Encounter (Unsigned)
 Copied from CRM [id number]. Topic: Clinical - Medication Refill >> Feb 28, 2024  8:56 AM Carlatta H wrote: Medication: Vitamin D , Ergocalciferol , (DRISDOL ) 1.25 MG (50000 UNIT) CAPS capsule  Has the patient contacted their pharmacy? Yes (Agent: If no, request that the patient contact the pharmacy for the refill. If patient does not wish to contact the pharmacy document the reason why and proceed with request.) (Agent: If yes, when and what did the pharmacy advise?)  This is the patient's preferred pharmacy:  [id number], [place] - [address] AT [facility] OF [address] [place] [address] Phone: [phone] Fax: [phone]  Is this the correct pharmacy for this prescription? Yes If no, delete pharmacy and type the correct one.   Has the prescription been filled recently? No  Is the patient out of the medication? Yes  Has the patient been seen for an appointment in the last year OR does the patient have an upcoming appointment? Yes  Can we respond through MyChart? No  Agent: Please be advised that Rx refills may take up to 3 business days. We ask that you follow-up with your pharmacy.

## 2024-02-29 MED ORDER — VITAMIN D (ERGOCALCIFEROL) 1.25 MG (50000 UNIT) PO CAPS: ORAL_CAPSULE | ORAL | 0 refills | Status: AC

## 2024-02-29 NOTE — Telephone Encounter (Signed)
 Requested medication (s) are due for refill today - provider review   Requested medication (s) are on the active medication list -yes  Future visit scheduled -no  Last refill: 08/31/23 #3 1RF  Notes to clinic: high dose Rx- requires provider review , PCP retired   Requested Prescriptions  Pending Prescriptions Disp Refills   Vitamin D , Ergocalciferol , (DRISDOL ) 1.25 MG (50000 UNIT) CAPS capsule 3 capsule 1    Sig: Take 1 pill every 30 days     Endocrinology:  Vitamins - Vitamin D  Supplementation 2 Failed - 02/29/2024  4:28 PM      Failed - Manual Review: Route requests for 50,000 IU strength to the provider      Failed - Vitamin D  in normal range and within 360 days    No results found for: [id number], [id number], [id number], 25OHVITD3, 25OHVITD2, 25OHVITD1, VD25OH       Passed - Ca in normal range and within 360 days    Calcium   Date Value Ref Range Status  10/12/2023 9.8 8.6 - 10.2 mg/dL Final   Calcium , Total  Date Value Ref Range Status  06/03/2012 8.9 8.5 - 10.1 mg/dL Final         Passed - Valid encounter within last 12 months    Recent Outpatient Visits           4 months ago Essential hypertension   [facility] Cathryne JAYSON, MD                 Requested Prescriptions  Pending Prescriptions Disp Refills   Vitamin D , Ergocalciferol , (DRISDOL ) 1.25 MG (50000 UNIT) CAPS capsule 3 capsule 1    Sig: Take 1 pill every 30 days     Endocrinology:  Vitamins - Vitamin D  Supplementation 2 Failed - 02/29/2024  4:28 PM      Failed - Manual Review: Route requests for 50,000 IU strength to the provider      Failed - Vitamin D  in normal range and within 360 days    No results found for: [id number], [id number], [id number], 25OHVITD3, 25OHVITD2, 25OHVITD1, VD25OH       Passed - Ca in normal range and within 360 days    Calcium   Date Value Ref Range Status  10/12/2023 9.8 8.6 - 10.2 mg/dL  Final   Calcium , Total  Date Value Ref Range Status  06/03/2012 8.9 8.5 - 10.1 mg/dL Final         Passed - Valid encounter within last 12 months    Recent Outpatient Visits           4 months ago Essential hypertension   [facility] Jones, Deanna C, MD

## 2024-04-01 ENCOUNTER — Emergency Department

## 2024-04-01 ENCOUNTER — Emergency Department
Admission: EM | Admit: 2024-04-01 | Discharge: 2024-04-01 | Disposition: A | Discharge status: Home / Self Care | Attending: Emergency Medicine | Admitting: Emergency Medicine

## 2024-04-01 ENCOUNTER — Other Ambulatory Visit: Payer: Self-pay

## 2024-04-01 ENCOUNTER — Encounter: Payer: Self-pay | Admitting: Emergency Medicine

## 2024-04-01 DIAGNOSIS — W06XXXA Fall from bed, initial encounter: Secondary | ICD-10-CM | POA: Diagnosis not present

## 2024-04-01 DIAGNOSIS — S0181XA Laceration without foreign body of other part of head, initial encounter: Secondary | ICD-10-CM | POA: Diagnosis not present

## 2024-04-01 DIAGNOSIS — N189 Chronic kidney disease, unspecified: Secondary | ICD-10-CM | POA: Diagnosis not present

## 2024-04-01 DIAGNOSIS — S0083XA Contusion of other part of head, initial encounter: Secondary | ICD-10-CM

## 2024-04-01 DIAGNOSIS — S0990XA Unspecified injury of head, initial encounter: Secondary | ICD-10-CM | POA: Diagnosis present

## 2024-04-01 DIAGNOSIS — I129 Hypertensive chronic kidney disease with stage 1 through stage 4 chronic kidney disease, or unspecified chronic kidney disease: Secondary | ICD-10-CM | POA: Diagnosis not present

## 2024-04-01 DIAGNOSIS — Z23 Encounter for immunization: Secondary | ICD-10-CM | POA: Diagnosis not present

## 2024-04-01 DIAGNOSIS — Y92009 Unspecified place in unspecified non-institutional (private) residence as the place of occurrence of the external cause: Secondary | ICD-10-CM | POA: Diagnosis not present

## 2024-04-01 DIAGNOSIS — W19XXXA Unspecified fall, initial encounter: Secondary | ICD-10-CM

## 2024-04-01 MED ORDER — TETANUS-DIPHTH-ACELL PERTUSSIS 5-2-15.5 LF-MCG/0.5 IM SUSP
0.5000 mL | Freq: Once | INTRAMUSCULAR | Status: AC
  Administered 2024-04-01: 0.5 mL via INTRAMUSCULAR
  Filled 2024-04-01: qty 0.5

## 2024-04-01 MED ORDER — CEPHALEXIN 500 MG PO CAPS: 500.0000 mg | ORAL_CAPSULE | Freq: Three times a day (TID) | ORAL | 0 refills | Status: AC

## 2024-04-01 NOTE — Discharge Instructions (Signed)
 Follow-up with your primary care if any continued problems or concerns.  Use a warm moist cloth to your jaw 2-3 times a day.  A prescription for antibiotics was sent to the pharmacy for you to begin taking 3 times a day for the next 7 days.  Watch for any signs of infection especially if you begin having a fever or see drainage from your face.

## 2024-04-01 NOTE — ED Notes (Signed)
 Pt sts that he has tenderness and a bump to the left jaw. No redness or open wound.

## 2024-04-01 NOTE — ED Triage Notes (Signed)
 Pt via POV from home. Pt c/o chin pain, pt rolled out of his bed on Thursday, hitting the L side of his chin. Small puncture wound and swelling noted. Denies blood thinners. Denies LOC. Pt is A&Ox4 and NAD, ambulatory to triage with steady gait.

## 2024-04-01 NOTE — ED Provider Notes (Signed)
 [facility] Provider Note    Event Date/Time   First MD Initiated Contact with Patient 04/01/24 [phone]     (approximate)   History   Facial Pain   HPI  Victor Walsh is a 88 y.o. male   presents to the ED with complaint of left-sided chin pain after an injury that occurred 5 days ago.  Patient states that he rolled out of his bed and hit his chin on a radiator that was near his bed.  He denies any LOC but has continued to have problems with his chin.  He believes that he had a small puncture wound in the area that has swollen.  He denies any injury to his teeth, headaches, nausea, vomiting, dizziness.  He has continued with his regular activities including driving to [place] and back without any difficulties.  Patient has history of hypertension, BPH, vitamin D  deficiency, chronic kidney disease.      Physical Exam   Triage Vital Signs: ED Triage Vitals  Encounter Vitals Group     BP 04/01/24 0851 (!) 165/105     Girls Systolic BP Percentile --      Girls Diastolic BP Percentile --      Boys Systolic BP Percentile --      Boys Diastolic BP Percentile --      Pulse Rate 04/01/24 0851 96     Resp 04/01/24 0851 18     Temp 04/01/24 0851 98.4 F (36.9 C)     Temp Source 04/01/24 0851 Oral     SpO2 04/01/24 0851 98 %     Weight 04/01/24 0848 190 lb (86.2 kg)     Height 04/01/24 0848 5' 9 (1.753 m)     Head Circumference --      Peak Flow --      Pain Score 04/01/24 0848 0     Pain Loc --      Pain Education --      Exclude from Growth Chart --     Most recent vital signs: Vitals:   04/01/24 0851 04/01/24 1119  BP: (!) 165/105 (!) 150/101  Pulse: 96   Resp: 18   Temp: 98.4 F (36.9 C)   SpO2: 98%      General: Awake, no distress.  Alert, talkative, answers questions appropriately. CV:  Good peripheral perfusion.  Resp:  Normal effort.  Abd:  No distention.  Other:  Left mandible with small tender nodule.  No skin opening at this time.   No drainage at this area.  Nontender dental area and without dental trauma.  Patient is speaking without any difficulty.  Nontender cervical spine to palpation posteriorly.   ED Results / Procedures / Treatments   Labs (all labs ordered are listed, but only abnormal results are displayed) Labs Reviewed - No data to display    RADIOLOGY CT maxillofacial, head and cervical spine without contrast per radiology is negative for acute intracranial findings, no facial fracture, no cervical fracture or dislocation.   Mandible x-ray images were reviewed and interpreted by myself independent of the radiologist and no fracture dislocation was noted.   PROCEDURES:  Critical Care performed:   Procedures   MEDICATIONS ORDERED IN ED: Medications  Tdap (ADACEL) injection 0.5 mL (0.5 mLs Intramuscular Given 04/01/24 0941)     IMPRESSION / MDM / ASSESSMENT AND PLAN / ED COURSE  I reviewed the triage vital signs and the nursing notes.   Differential diagnosis includes, but is not  limited to, fracture mandible, contusion face, minor head injury, cervical strain, cervical pain, cervical fracture, subluxation, abscess, hematoma.  88 year old male presents to the ED 6 days after an injury that occurred when he rolled out of his bed hitting his face on a radiator that was lying on the floor.  Patient believes that he had a puncture wound to the left side of his mandible which is now healed.  Area is tender.  CT head, cervical spine and maxillofacial were negative as was his plain film mandible images.  Patient was reassured.  Tdap was given.  A prescription for cephalexin 500 mg 3 times daily for 7 days was sent to the pharmacy and patient is to use warm compresses to the area as needed for discomfort.  He is to follow-up with his PCP or return to the emergency department if any severe worsening of his symptoms or urgent concerns.      Patient's presentation is most consistent with acute presentation  with potential threat to life or bodily function.  FINAL CLINICAL IMPRESSION(S) / ED DIAGNOSES   Final diagnoses:  Facial contusion, initial encounter  Fall at home, initial encounter     Rx / DC Orders   ED Discharge Orders          Ordered    cephALEXin (KEFLEX) 500 MG capsule  3 times daily        04/01/24 1116             Note:  This document was prepared using Dragon voice recognition software and may include unintentional dictation errors.   Saunders Shona CROME, PA-C 04/01/24 1547    Floy Roberts, MD 04/02/24 [phone]
